# Patient Record
Sex: Female | Born: 1945 | Race: White | Hispanic: No | Marital: Single | Smoking: Current every day smoker
Health system: Southern US, Community
[De-identification: ages and names within clinical notes are randomized; demographics above are authoritative.]

## PROBLEM LIST (undated history)

## (undated) DIAGNOSIS — I639 Cerebral infarction, unspecified: Secondary | ICD-10-CM

## (undated) DIAGNOSIS — I4891 Unspecified atrial fibrillation: Secondary | ICD-10-CM

## (undated) DIAGNOSIS — M810 Age-related osteoporosis without current pathological fracture: Secondary | ICD-10-CM

## (undated) DIAGNOSIS — F17200 Nicotine dependence, unspecified, uncomplicated: Secondary | ICD-10-CM

## (undated) DIAGNOSIS — F329 Major depressive disorder, single episode, unspecified: Secondary | ICD-10-CM

## (undated) DIAGNOSIS — J449 Chronic obstructive pulmonary disease, unspecified: Secondary | ICD-10-CM

## (undated) DIAGNOSIS — F3289 Other specified depressive episodes: Secondary | ICD-10-CM

## (undated) DIAGNOSIS — R011 Cardiac murmur, unspecified: Secondary | ICD-10-CM

## (undated) DIAGNOSIS — M1990 Unspecified osteoarthritis, unspecified site: Secondary | ICD-10-CM

## (undated) DIAGNOSIS — J4489 Other specified chronic obstructive pulmonary disease: Secondary | ICD-10-CM

## (undated) DIAGNOSIS — Z8679 Personal history of other diseases of the circulatory system: Secondary | ICD-10-CM

## (undated) DIAGNOSIS — Z86718 Personal history of other venous thrombosis and embolism: Secondary | ICD-10-CM

## (undated) DIAGNOSIS — G8929 Other chronic pain: Secondary | ICD-10-CM

## (undated) DIAGNOSIS — I509 Heart failure, unspecified: Secondary | ICD-10-CM

## (undated) DIAGNOSIS — M549 Dorsalgia, unspecified: Secondary | ICD-10-CM

## (undated) DIAGNOSIS — E785 Hyperlipidemia, unspecified: Secondary | ICD-10-CM

## (undated) DIAGNOSIS — I10 Essential (primary) hypertension: Secondary | ICD-10-CM

## (undated) DIAGNOSIS — J45909 Unspecified asthma, uncomplicated: Secondary | ICD-10-CM

## (undated) HISTORY — DX: Heart failure, unspecified: I50.9

## (undated) HISTORY — DX: Age-related osteoporosis without current pathological fracture: M81.0

## (undated) HISTORY — DX: Personal history of other venous thrombosis and embolism: Z86.718

## (undated) HISTORY — DX: Unspecified asthma, uncomplicated: J45.909

## (undated) HISTORY — DX: Major depressive disorder, single episode, unspecified: F32.9

## (undated) HISTORY — DX: Chronic obstructive pulmonary disease, unspecified: J44.9

## (undated) HISTORY — DX: Essential (primary) hypertension: I10

## (undated) HISTORY — DX: Personal history of other diseases of the circulatory system: Z86.79

## (undated) HISTORY — PX: TONSILLECTOMY: SUR1361

## (undated) HISTORY — DX: Other specified depressive episodes: F32.89

## (undated) HISTORY — DX: Cardiac murmur, unspecified: R01.1

## (undated) HISTORY — DX: Unspecified atrial fibrillation: I48.91

## (undated) HISTORY — DX: Other specified chronic obstructive pulmonary disease: J44.89

## (undated) HISTORY — DX: Nicotine dependence, unspecified, uncomplicated: F17.200

## (undated) HISTORY — DX: Hyperlipidemia, unspecified: E78.5

---

## 1969-07-09 HISTORY — PX: APPENDECTOMY: SHX54

## 1999-11-09 DIAGNOSIS — Z86718 Personal history of other venous thrombosis and embolism: Secondary | ICD-10-CM

## 1999-11-09 HISTORY — DX: Personal history of other venous thrombosis and embolism: Z86.718

## 2002-10-05 ENCOUNTER — Emergency Department (HOSPITAL_COMMUNITY): Admission: EM | Admit: 2002-10-05 | Discharge: 2002-10-05 | Payer: Self-pay

## 2003-11-09 LAB — HM MAMMOGRAPHY: HM Mammogram: NORMAL

## 2004-06-08 HISTORY — PX: GASTRIC BYPASS: SHX52

## 2004-11-18 ENCOUNTER — Ambulatory Visit: Payer: Self-pay | Admitting: Internal Medicine

## 2004-11-24 ENCOUNTER — Ambulatory Visit: Payer: Self-pay

## 2004-11-30 ENCOUNTER — Ambulatory Visit: Payer: Self-pay | Admitting: Internal Medicine

## 2004-11-30 ENCOUNTER — Other Ambulatory Visit: Admission: RE | Admit: 2004-11-30 | Discharge: 2004-11-30 | Payer: Self-pay | Admitting: Internal Medicine

## 2004-12-08 ENCOUNTER — Ambulatory Visit: Payer: Self-pay | Admitting: Cardiology

## 2004-12-18 ENCOUNTER — Ambulatory Visit: Payer: Self-pay | Admitting: Cardiology

## 2005-01-01 ENCOUNTER — Ambulatory Visit: Payer: Self-pay | Admitting: Cardiology

## 2005-01-08 ENCOUNTER — Ambulatory Visit: Payer: Self-pay | Admitting: Cardiology

## 2005-01-18 ENCOUNTER — Ambulatory Visit: Payer: Self-pay | Admitting: Cardiology

## 2005-02-01 ENCOUNTER — Ambulatory Visit: Payer: Self-pay | Admitting: Internal Medicine

## 2005-02-01 ENCOUNTER — Ambulatory Visit: Payer: Self-pay | Admitting: Cardiology

## 2005-02-08 ENCOUNTER — Ambulatory Visit: Payer: Self-pay | Admitting: Cardiology

## 2005-02-22 ENCOUNTER — Ambulatory Visit: Payer: Self-pay | Admitting: *Deleted

## 2005-03-15 ENCOUNTER — Ambulatory Visit: Payer: Self-pay | Admitting: *Deleted

## 2005-04-12 ENCOUNTER — Ambulatory Visit: Payer: Self-pay | Admitting: Cardiology

## 2005-05-10 ENCOUNTER — Ambulatory Visit: Payer: Self-pay | Admitting: Cardiology

## 2005-05-21 ENCOUNTER — Ambulatory Visit: Payer: Self-pay | Admitting: Cardiovascular Disease

## 2005-06-23 ENCOUNTER — Ambulatory Visit: Payer: Self-pay | Admitting: Cardiology

## 2005-07-15 ENCOUNTER — Ambulatory Visit: Payer: Self-pay | Admitting: Internal Medicine

## 2005-08-13 ENCOUNTER — Ambulatory Visit: Payer: Self-pay | Admitting: Internal Medicine

## 2005-08-16 ENCOUNTER — Ambulatory Visit: Payer: Self-pay | Admitting: Cardiology

## 2005-10-01 ENCOUNTER — Ambulatory Visit: Payer: Self-pay | Admitting: Internal Medicine

## 2005-10-02 ENCOUNTER — Inpatient Hospital Stay (HOSPITAL_COMMUNITY): Admission: EM | Admit: 2005-10-02 | Discharge: 2005-10-03 | Payer: Self-pay | Admitting: Emergency Medicine

## 2005-10-05 ENCOUNTER — Ambulatory Visit: Payer: Self-pay | Admitting: Internal Medicine

## 2005-10-13 ENCOUNTER — Ambulatory Visit: Payer: Self-pay | Admitting: Gastroenterology

## 2005-10-13 ENCOUNTER — Ambulatory Visit: Payer: Self-pay | Admitting: Internal Medicine

## 2005-10-26 ENCOUNTER — Encounter (INDEPENDENT_AMBULATORY_CARE_PROVIDER_SITE_OTHER): Payer: Self-pay | Admitting: Specialist

## 2005-10-26 ENCOUNTER — Ambulatory Visit (HOSPITAL_COMMUNITY): Admission: RE | Admit: 2005-10-26 | Discharge: 2005-10-26 | Payer: Self-pay | Admitting: Gastroenterology

## 2005-10-26 ENCOUNTER — Ambulatory Visit: Payer: Self-pay | Admitting: Gastroenterology

## 2005-12-13 ENCOUNTER — Ambulatory Visit: Payer: Self-pay | Admitting: Internal Medicine

## 2006-01-10 ENCOUNTER — Ambulatory Visit: Payer: Self-pay | Admitting: Cardiology

## 2006-02-07 ENCOUNTER — Ambulatory Visit: Payer: Self-pay | Admitting: Cardiology

## 2006-03-07 ENCOUNTER — Ambulatory Visit: Payer: Self-pay | Admitting: Cardiology

## 2006-04-12 ENCOUNTER — Ambulatory Visit: Payer: Self-pay | Admitting: Cardiology

## 2006-04-26 ENCOUNTER — Ambulatory Visit: Payer: Self-pay | Admitting: Cardiology

## 2006-07-18 ENCOUNTER — Ambulatory Visit: Payer: Self-pay | Admitting: *Deleted

## 2006-08-19 ENCOUNTER — Ambulatory Visit: Payer: Self-pay | Admitting: Cardiology

## 2006-09-02 ENCOUNTER — Ambulatory Visit: Payer: Self-pay | Admitting: Internal Medicine

## 2006-10-10 ENCOUNTER — Ambulatory Visit: Payer: Self-pay | Admitting: Cardiology

## 2006-10-21 ENCOUNTER — Ambulatory Visit: Payer: Self-pay | Admitting: Internal Medicine

## 2006-11-04 ENCOUNTER — Ambulatory Visit: Payer: Self-pay | Admitting: Cardiology

## 2006-11-18 ENCOUNTER — Ambulatory Visit: Payer: Self-pay | Admitting: Internal Medicine

## 2006-12-09 ENCOUNTER — Ambulatory Visit: Payer: Self-pay | Admitting: Cardiology

## 2007-02-22 ENCOUNTER — Ambulatory Visit: Payer: Self-pay | Admitting: *Deleted

## 2007-03-08 ENCOUNTER — Ambulatory Visit: Payer: Self-pay | Admitting: Cardiology

## 2007-03-27 ENCOUNTER — Ambulatory Visit: Payer: Self-pay | Admitting: Internal Medicine

## 2007-04-10 ENCOUNTER — Ambulatory Visit: Payer: Self-pay | Admitting: Internal Medicine

## 2007-04-24 ENCOUNTER — Ambulatory Visit: Payer: Self-pay | Admitting: Cardiology

## 2007-05-25 ENCOUNTER — Ambulatory Visit: Payer: Self-pay | Admitting: Cardiology

## 2007-06-02 ENCOUNTER — Ambulatory Visit: Payer: Self-pay | Admitting: Cardiovascular Disease

## 2007-06-16 ENCOUNTER — Ambulatory Visit: Payer: Self-pay | Admitting: Cardiology

## 2007-07-03 ENCOUNTER — Ambulatory Visit: Payer: Self-pay | Admitting: Cardiology

## 2007-07-31 ENCOUNTER — Ambulatory Visit: Payer: Self-pay | Admitting: Internal Medicine

## 2007-08-28 ENCOUNTER — Ambulatory Visit: Payer: Self-pay | Admitting: Internal Medicine

## 2007-09-11 DIAGNOSIS — R011 Cardiac murmur, unspecified: Secondary | ICD-10-CM | POA: Insufficient documentation

## 2007-09-11 DIAGNOSIS — Z862 Personal history of diseases of the blood and blood-forming organs and certain disorders involving the immune mechanism: Secondary | ICD-10-CM | POA: Insufficient documentation

## 2007-09-11 DIAGNOSIS — I69959 Hemiplegia and hemiparesis following unspecified cerebrovascular disease affecting unspecified side: Secondary | ICD-10-CM | POA: Insufficient documentation

## 2007-09-11 DIAGNOSIS — I4891 Unspecified atrial fibrillation: Secondary | ICD-10-CM | POA: Insufficient documentation

## 2007-09-11 DIAGNOSIS — I10 Essential (primary) hypertension: Secondary | ICD-10-CM | POA: Insufficient documentation

## 2007-09-11 DIAGNOSIS — Z86718 Personal history of other venous thrombosis and embolism: Secondary | ICD-10-CM

## 2007-09-11 DIAGNOSIS — Z8679 Personal history of other diseases of the circulatory system: Secondary | ICD-10-CM

## 2007-09-11 DIAGNOSIS — Z8639 Personal history of other endocrine, nutritional and metabolic disease: Secondary | ICD-10-CM

## 2007-09-11 DIAGNOSIS — J45909 Unspecified asthma, uncomplicated: Secondary | ICD-10-CM | POA: Insufficient documentation

## 2007-09-15 ENCOUNTER — Ambulatory Visit: Payer: Self-pay | Admitting: Internal Medicine

## 2007-09-16 LAB — CONVERTED CEMR LAB
ALT: 18 units/L (ref 0–35)
AST: 22 units/L (ref 0–37)
Alkaline Phosphatase: 123 units/L — ABNORMAL HIGH (ref 39–117)
BUN: 20 mg/dL (ref 6–23)
Basophils Relative: 0.2 % (ref 0.0–1.0)
CO2: 33 meq/L — ABNORMAL HIGH (ref 19–32)
Calcium: 9.5 mg/dL (ref 8.4–10.5)
Chloride: 102 meq/L (ref 96–112)
Eosinophils Absolute: 0.1 10*3/uL (ref 0.0–0.6)
Eosinophils Relative: 1.7 % (ref 0.0–5.0)
GFR calc Af Amer: 132 mL/min
GFR calc non Af Amer: 109 mL/min
Glucose, Bld: 105 mg/dL — ABNORMAL HIGH (ref 70–99)
HDL: 37.4 mg/dL — ABNORMAL LOW (ref >39.0)
Lymphocytes Relative: 23.8 % (ref 12.0–46.0)
Monocytes Relative: 6.9 % (ref 3.0–11.0)
Neutro Abs: 5.3 10*3/uL (ref 1.4–7.7)
Platelets: 284 10*3/uL (ref 150–400)
RBC: 4.39 M/uL (ref 3.87–5.11)
Total CHOL/HDL Ratio: 5.3
Triglycerides: 165 mg/dL — ABNORMAL HIGH (ref 0–149)
VLDL: 33 mg/dL (ref 0–40)
WBC: 7.7 10*3/uL (ref 4.5–10.5)

## 2007-09-17 DIAGNOSIS — I509 Heart failure, unspecified: Secondary | ICD-10-CM | POA: Insufficient documentation

## 2007-09-17 DIAGNOSIS — E785 Hyperlipidemia, unspecified: Secondary | ICD-10-CM

## 2007-09-17 DIAGNOSIS — Z8679 Personal history of other diseases of the circulatory system: Secondary | ICD-10-CM | POA: Insufficient documentation

## 2007-09-26 ENCOUNTER — Telehealth: Payer: Self-pay | Admitting: Internal Medicine

## 2007-10-03 IMAGING — CT CT ANGIO ABDOMEN
2 of 6 series · 16 of 46 positions shown, 18 images · IV contrast (omnipaque)
Comparison: none

CLINICAL DATA: chest pain, back pain, abdominal pain
CT ANGIOGRAPHY OF CHEST:
TECHNIQUE: Multidetector CT imaging of the chest was performed during bolus injection of intravenous contrast.  Multiplanar CT angiographic image reconstructions were generated to evaluate the vascular anatomy.
Contrast:  125 cc Omnipaque 300
TECHNIQUE: Multidetector CT imaging of the abdomen was performed during bolus injection of intravenous contrast.  Multiplanar CT angiographic image reconstructions were generated to evaluate the vascular anatomy.

[Series 4: dissection 2.0 st · axial · 0.65mm/px · z∈[-526,-8]mm · 13 of 299 slices shown, 15 images]
[im 20/299  soft-tissue]
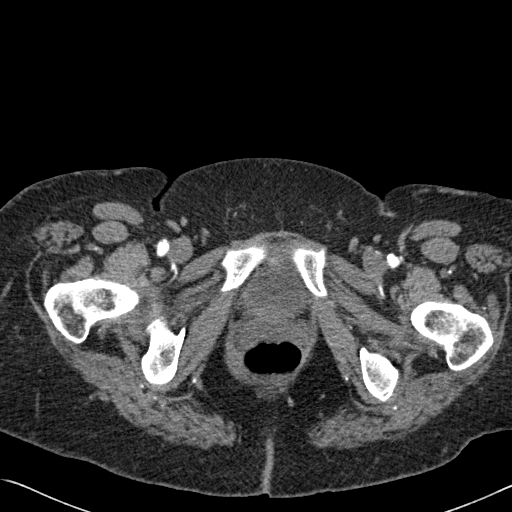
[im 20/299  bone]
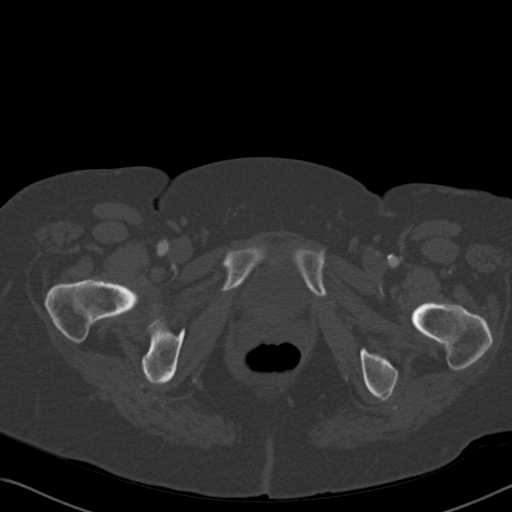
[im 40/299  soft-tissue]
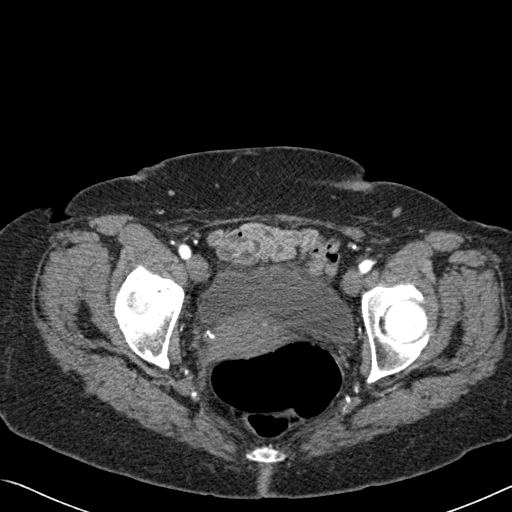
[im 60/299  soft-tissue]
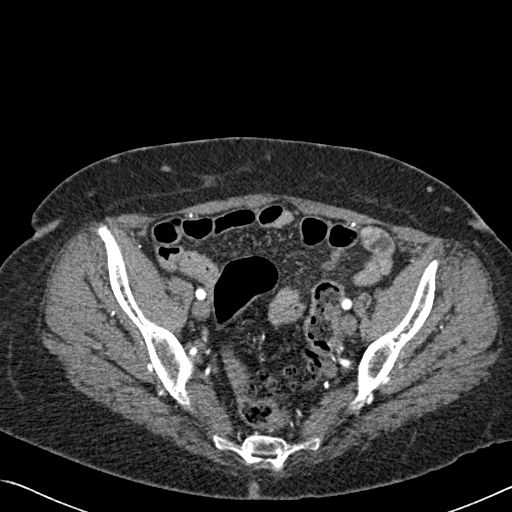
[im 80/299  soft-tissue]
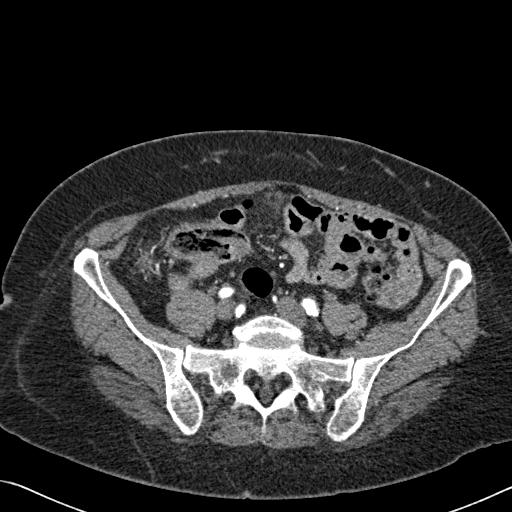
[im 100/299  soft-tissue]
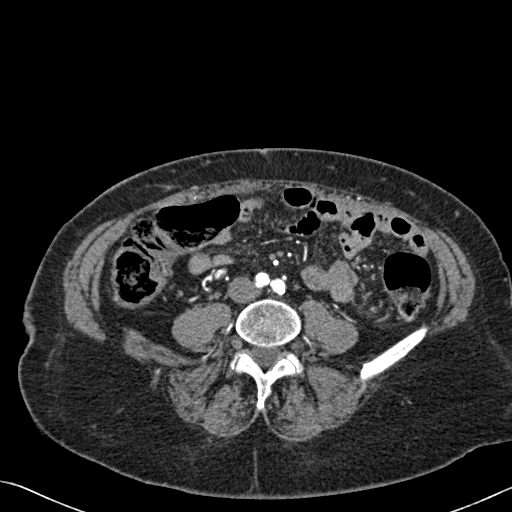
[im 120/299  soft-tissue]
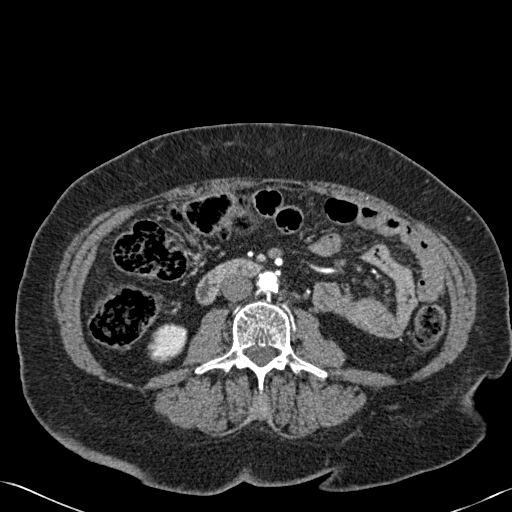
[im 159/299  soft-tissue]
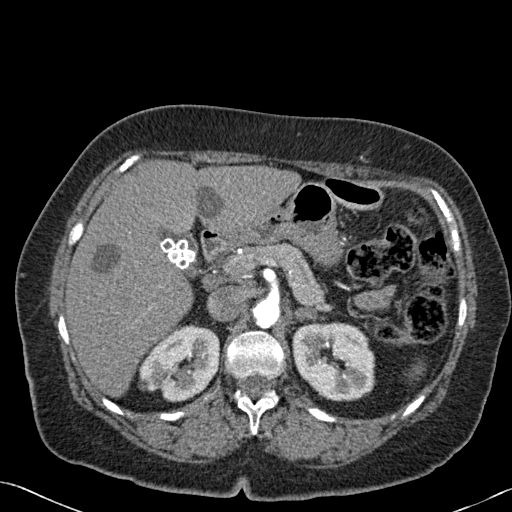
[im 179/299  soft-tissue]
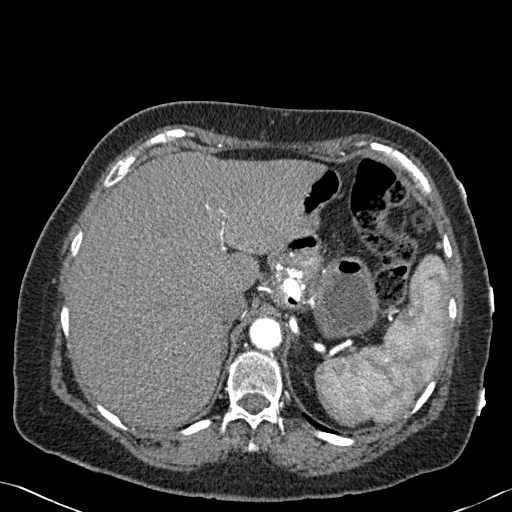
[im 199/299  soft-tissue]
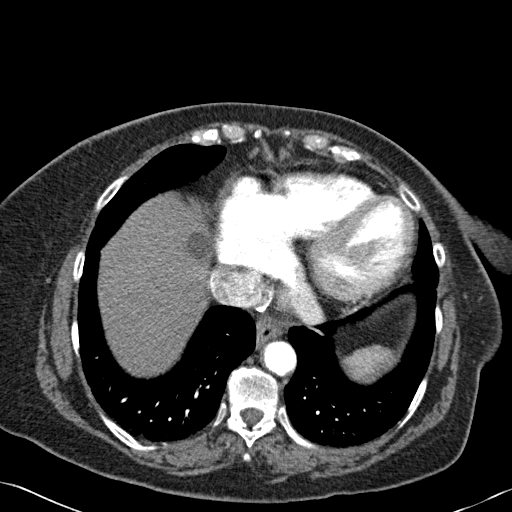
[im 199/299  bone]
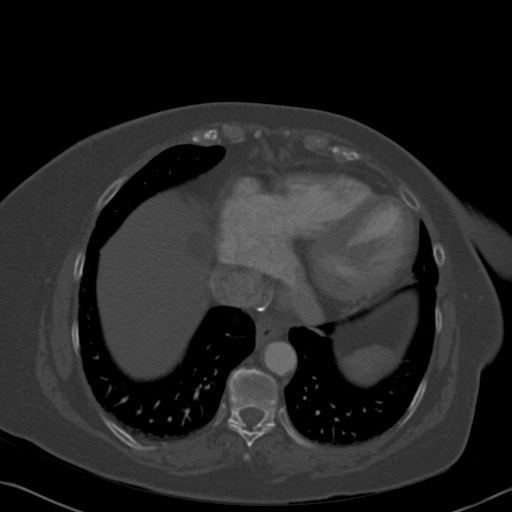
[im 219/299  soft-tissue]
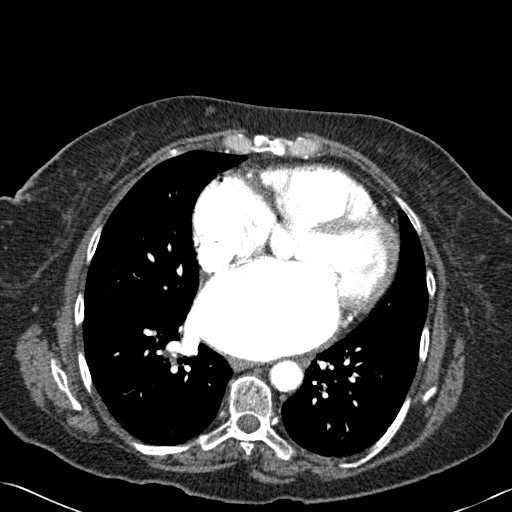
[im 239/299  soft-tissue]
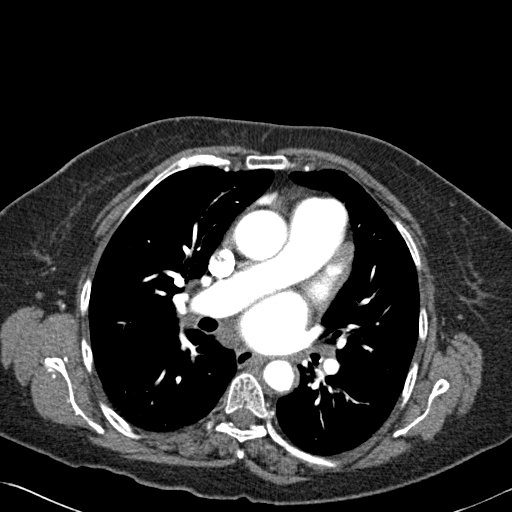
[im 259/299  soft-tissue]
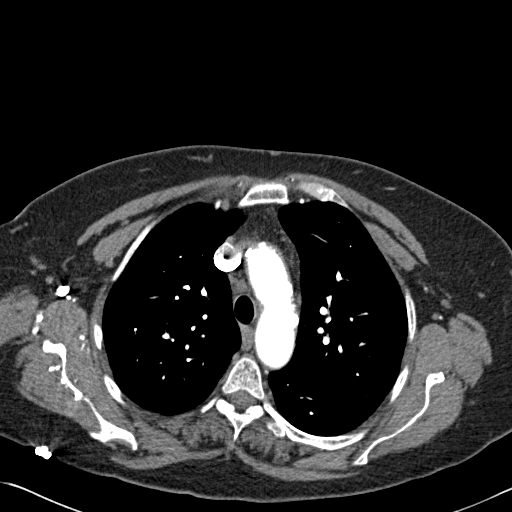
[im 279/299  soft-tissue]
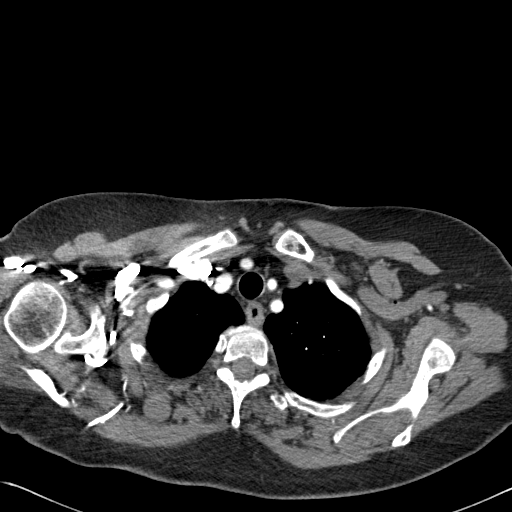

[Series 602: coronals · coronal · 1.17mm/px · 3 of 118 slices shown]
[im 40/118  soft-tissue]
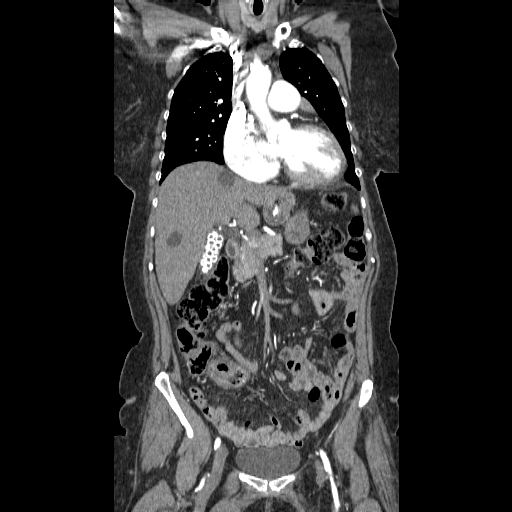
[im 53/118  soft-tissue]
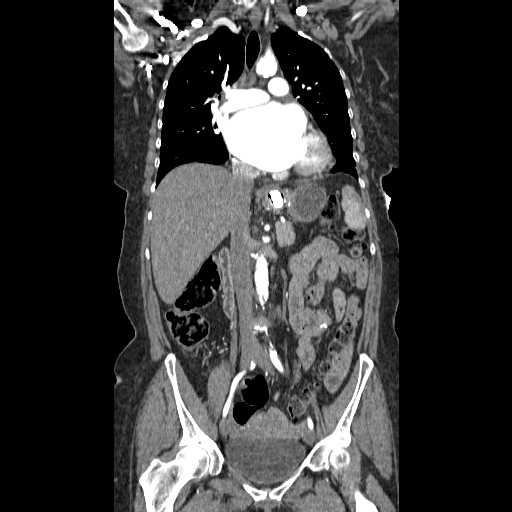
[im 66/118  soft-tissue]
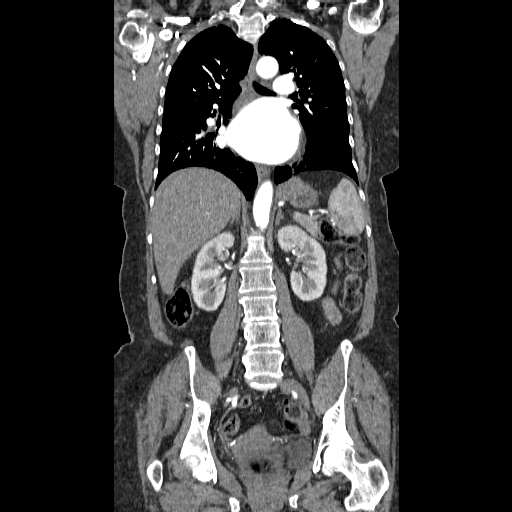

[16 of 46 positions shown; findings below may reference images not displayed]

FINDINGS: There are no filling defects identified in the pulmonary arterial system to suggest pulmonary emboli.  There is no evidence of aortic dissection or aneurysm.  Cardiomegaly with moderate to marked biatrial enlargement, left greater than right, noted.  There is no evidence of pleural or pericardial effusions.  Upper limits normal right hilar lymph nodes may be reactive.  Mild peribronchial thickening is identified in the lungs.  Remainder of the lungs are clear.  A 1.2 cm rounded lesion in the right breast is identified.  Visualized portions of the thyroid gland are unremarkable.
IMPRESSION: 1.  No evidence of aortic dissection or pulmonary emboli.
2.  Cardiomegaly with moderate to marked biatrial enlargement.
3.  Indeterminate 1.2cm rounded lesion in the right breast.  Recommend mammography and physical exam.
CT ANGIOGRAPHY OF ABDOMEN:
FINDINGS: There is no evidence of aortic dissection or aneurysm.  Mild atherosclerotic plaque and calcification of the abdominal aorta is noted.  Multiple bilateral hepatic cysts are identified.  Scarring in the posterior right upper kidney is identified but the remainder of the kidneys are unremarkable.  Multiple gallstones are present.  The adrenal glands and pancreas are unremarkable.  Two nodules along the colon at the splenic flexure (images # 113 through # 118) with the largest posterior nodule measuring 1cm.  Mildly enlarged periportal lymph nodes are identified.  There is evidence of gastric surgery with high-density material within the stomach.  No evidence of free fluid or biliary dilatation.
IMPRESSION: 1  No evidence of aortic aneurysm or dissection.
2.  Two nodules along the colon at the splenic flexure and mildly enlarged periportal lymph nodes.   2 to 3 month followup or PET/CT to assess stability/metabolic activity.
3.    Cholelithiasis.

## 2007-10-17 ENCOUNTER — Encounter: Admission: RE | Admit: 2007-10-17 | Discharge: 2007-10-17 | Payer: Self-pay | Admitting: Internal Medicine

## 2007-10-17 ENCOUNTER — Encounter: Payer: Self-pay | Admitting: Internal Medicine

## 2007-11-30 ENCOUNTER — Encounter: Admission: RE | Admit: 2007-11-30 | Discharge: 2007-11-30 | Payer: Self-pay | Admitting: Internal Medicine

## 2007-12-27 ENCOUNTER — Ambulatory Visit: Payer: Self-pay | Admitting: Internal Medicine

## 2008-01-09 ENCOUNTER — Ambulatory Visit: Payer: Self-pay | Admitting: Internal Medicine

## 2008-01-16 ENCOUNTER — Encounter: Payer: Self-pay | Admitting: Internal Medicine

## 2008-01-23 ENCOUNTER — Ambulatory Visit: Payer: Self-pay | Admitting: Cardiology

## 2008-02-06 ENCOUNTER — Ambulatory Visit: Payer: Self-pay | Admitting: Internal Medicine

## 2008-02-27 ENCOUNTER — Ambulatory Visit: Payer: Self-pay | Admitting: Cardiology

## 2008-03-26 ENCOUNTER — Ambulatory Visit: Payer: Self-pay | Admitting: Cardiology

## 2008-04-23 ENCOUNTER — Ambulatory Visit: Payer: Self-pay | Admitting: Cardiology

## 2008-05-23 ENCOUNTER — Ambulatory Visit: Payer: Self-pay | Admitting: Cardiovascular Disease

## 2008-06-25 ENCOUNTER — Ambulatory Visit: Payer: Self-pay | Admitting: Cardiology

## 2008-09-02 ENCOUNTER — Ambulatory Visit: Payer: Self-pay | Admitting: Cardiology

## 2008-10-25 ENCOUNTER — Ambulatory Visit: Payer: Self-pay | Admitting: Cardiovascular Disease

## 2008-11-22 ENCOUNTER — Ambulatory Visit: Payer: Self-pay | Admitting: Cardiology

## 2009-02-03 ENCOUNTER — Ambulatory Visit: Payer: Self-pay | Admitting: Cardiology

## 2009-02-24 ENCOUNTER — Ambulatory Visit: Payer: Self-pay | Admitting: Cardiology

## 2009-03-10 ENCOUNTER — Ambulatory Visit: Payer: Self-pay | Admitting: Internal Medicine

## 2009-03-10 ENCOUNTER — Telehealth (INDEPENDENT_AMBULATORY_CARE_PROVIDER_SITE_OTHER): Payer: Self-pay | Admitting: *Deleted

## 2009-03-10 DIAGNOSIS — F329 Major depressive disorder, single episode, unspecified: Secondary | ICD-10-CM

## 2009-03-10 DIAGNOSIS — F17200 Nicotine dependence, unspecified, uncomplicated: Secondary | ICD-10-CM

## 2009-03-22 ENCOUNTER — Telehealth: Payer: Self-pay | Admitting: Family Medicine

## 2009-04-08 ENCOUNTER — Encounter: Payer: Self-pay | Admitting: *Deleted

## 2009-04-28 ENCOUNTER — Ambulatory Visit: Payer: Self-pay | Admitting: Internal Medicine

## 2009-04-28 DIAGNOSIS — J4489 Other specified chronic obstructive pulmonary disease: Secondary | ICD-10-CM | POA: Insufficient documentation

## 2009-04-28 DIAGNOSIS — J449 Chronic obstructive pulmonary disease, unspecified: Secondary | ICD-10-CM

## 2009-05-14 ENCOUNTER — Encounter: Payer: Self-pay | Admitting: *Deleted

## 2009-05-26 ENCOUNTER — Ambulatory Visit: Payer: Self-pay | Admitting: Internal Medicine

## 2009-05-26 ENCOUNTER — Ambulatory Visit: Payer: Self-pay | Admitting: Cardiovascular Disease

## 2009-05-26 LAB — CONVERTED CEMR LAB: Prothrombin Time: 19.2 s

## 2009-06-24 ENCOUNTER — Ambulatory Visit: Payer: Self-pay | Admitting: Cardiovascular Disease

## 2009-07-15 ENCOUNTER — Ambulatory Visit: Payer: Self-pay | Admitting: Cardiology

## 2009-07-15 ENCOUNTER — Telehealth: Payer: Self-pay | Admitting: Internal Medicine

## 2009-07-15 LAB — CONVERTED CEMR LAB: POC INR: 2

## 2009-07-22 ENCOUNTER — Ambulatory Visit: Payer: Self-pay | Admitting: Internal Medicine

## 2009-07-28 ENCOUNTER — Telehealth: Payer: Self-pay | Admitting: Internal Medicine

## 2009-07-29 ENCOUNTER — Telehealth: Payer: Self-pay | Admitting: Internal Medicine

## 2009-07-29 ENCOUNTER — Ambulatory Visit: Payer: Self-pay | Admitting: Internal Medicine

## 2009-07-29 LAB — CONVERTED CEMR LAB
Basophils Absolute: 0 10*3/uL (ref 0.0–0.1)
Calcium: 9.4 mg/dL (ref 8.4–10.5)
Cholesterol: 182 mg/dL (ref 0–200)
GFR calc non Af Amer: 90.21 mL/min (ref >60)
HCT: 41 % (ref 36.0–46.0)
HDL: 40 mg/dL (ref >39.00)
Hemoglobin: 14.2 g/dL (ref 12.0–15.0)
Lymphs Abs: 1.2 10*3/uL (ref 0.7–4.0)
MCHC: 34.6 g/dL (ref 30.0–36.0)
MCV: 93 fL (ref 78.0–100.0)
Monocytes Absolute: 0.4 10*3/uL (ref 0.1–1.0)
Monocytes Relative: 5.8 % (ref 3.0–12.0)
Neutro Abs: 4.6 10*3/uL (ref 1.4–7.7)
Potassium: 3.8 meq/L (ref 3.5–5.1)
RDW: 12.4 % (ref 11.5–14.6)
Sodium: 142 meq/L (ref 135–145)
Triglycerides: 81 mg/dL (ref 0.0–149.0)
VLDL: 16.2 mg/dL (ref 0.0–40.0)

## 2009-08-05 ENCOUNTER — Encounter: Payer: Self-pay | Admitting: Internal Medicine

## 2009-08-05 ENCOUNTER — Ambulatory Visit: Payer: Self-pay | Admitting: Cardiology

## 2009-08-07 ENCOUNTER — Encounter: Payer: Self-pay | Admitting: Internal Medicine

## 2009-08-13 ENCOUNTER — Telehealth: Payer: Self-pay | Admitting: Internal Medicine

## 2009-08-26 ENCOUNTER — Ambulatory Visit: Payer: Self-pay | Admitting: Cardiology

## 2009-08-26 ENCOUNTER — Ambulatory Visit (HOSPITAL_COMMUNITY): Admission: RE | Admit: 2009-08-26 | Discharge: 2009-08-26 | Payer: Self-pay | Admitting: Obstetrics and Gynecology

## 2009-08-26 ENCOUNTER — Telehealth: Payer: Self-pay | Admitting: Internal Medicine

## 2009-08-29 ENCOUNTER — Telehealth: Payer: Self-pay | Admitting: Internal Medicine

## 2009-09-02 ENCOUNTER — Telehealth: Payer: Self-pay | Admitting: Internal Medicine

## 2009-09-13 ENCOUNTER — Ambulatory Visit: Payer: Self-pay | Admitting: Family Medicine

## 2009-09-13 DIAGNOSIS — J441 Chronic obstructive pulmonary disease with (acute) exacerbation: Secondary | ICD-10-CM

## 2009-09-16 ENCOUNTER — Ambulatory Visit: Payer: Self-pay | Admitting: Internal Medicine

## 2009-09-16 ENCOUNTER — Ambulatory Visit: Payer: Self-pay | Admitting: Cardiology

## 2009-11-26 ENCOUNTER — Encounter (INDEPENDENT_AMBULATORY_CARE_PROVIDER_SITE_OTHER): Payer: Self-pay | Admitting: Cardiology

## 2009-11-28 ENCOUNTER — Ambulatory Visit: Payer: Self-pay | Admitting: Cardiology

## 2009-11-28 LAB — CONVERTED CEMR LAB: POC INR: 1.6

## 2009-12-08 ENCOUNTER — Telehealth: Payer: Self-pay | Admitting: Internal Medicine

## 2009-12-12 ENCOUNTER — Ambulatory Visit: Payer: Self-pay | Admitting: Cardiovascular Disease

## 2009-12-16 ENCOUNTER — Ambulatory Visit: Payer: Self-pay | Admitting: Internal Medicine

## 2010-01-02 ENCOUNTER — Ambulatory Visit: Payer: Self-pay | Admitting: Internal Medicine

## 2010-01-30 ENCOUNTER — Ambulatory Visit: Payer: Self-pay | Admitting: Cardiology

## 2010-01-30 LAB — CONVERTED CEMR LAB: POC INR: 4.1

## 2010-02-20 ENCOUNTER — Ambulatory Visit: Payer: Self-pay | Admitting: Internal Medicine

## 2010-02-20 LAB — CONVERTED CEMR LAB: POC INR: 4.3

## 2010-03-10 ENCOUNTER — Ambulatory Visit: Payer: Self-pay | Admitting: Internal Medicine

## 2010-03-10 DIAGNOSIS — B070 Plantar wart: Secondary | ICD-10-CM

## 2010-03-10 DIAGNOSIS — M546 Pain in thoracic spine: Secondary | ICD-10-CM | POA: Insufficient documentation

## 2010-03-23 ENCOUNTER — Encounter: Payer: Self-pay | Admitting: Internal Medicine

## 2010-03-31 ENCOUNTER — Ambulatory Visit: Payer: Self-pay | Admitting: Internal Medicine

## 2010-03-31 LAB — CONVERTED CEMR LAB: POC INR: 3.4

## 2010-04-21 ENCOUNTER — Ambulatory Visit: Payer: Self-pay | Admitting: Cardiovascular Disease

## 2010-05-19 ENCOUNTER — Ambulatory Visit: Payer: Self-pay | Admitting: Cardiovascular Disease

## 2010-05-19 ENCOUNTER — Ambulatory Visit: Payer: Self-pay | Admitting: Internal Medicine

## 2010-05-19 LAB — CONVERTED CEMR LAB: POC INR: 1.9

## 2010-06-16 ENCOUNTER — Ambulatory Visit: Payer: Self-pay | Admitting: Cardiology

## 2010-06-30 ENCOUNTER — Ambulatory Visit: Payer: Self-pay | Admitting: Cardiovascular Disease

## 2010-08-11 ENCOUNTER — Ambulatory Visit: Payer: Self-pay | Admitting: Cardiology

## 2010-09-08 ENCOUNTER — Ambulatory Visit: Payer: Self-pay | Admitting: Cardiovascular Disease

## 2010-09-08 LAB — CONVERTED CEMR LAB: POC INR: 3.6

## 2010-09-22 ENCOUNTER — Ambulatory Visit: Payer: Self-pay | Admitting: Internal Medicine

## 2010-09-23 LAB — CONVERTED CEMR LAB
BUN: 35 mg/dL — ABNORMAL HIGH (ref 6–23)
Cholesterol: 213 mg/dL — ABNORMAL HIGH (ref 0–200)
Creatinine, Ser: 1 mg/dL (ref 0.4–1.2)
Direct LDL: 150.2 mg/dL
GFR calc non Af Amer: 63.18 mL/min (ref >60)
HDL: 48.6 mg/dL (ref >39.00)
Potassium: 3.9 meq/L (ref 3.5–5.1)
VLDL: 22.8 mg/dL (ref 0.0–40.0)

## 2010-09-25 ENCOUNTER — Encounter: Payer: Self-pay | Admitting: Internal Medicine

## 2010-09-25 ENCOUNTER — Ambulatory Visit: Payer: Self-pay | Admitting: Internal Medicine

## 2010-09-29 ENCOUNTER — Ambulatory Visit: Payer: Self-pay | Admitting: Cardiovascular Disease

## 2010-10-03 DIAGNOSIS — M810 Age-related osteoporosis without current pathological fracture: Secondary | ICD-10-CM

## 2010-10-20 ENCOUNTER — Ambulatory Visit: Payer: Self-pay | Admitting: Internal Medicine

## 2010-11-13 ENCOUNTER — Ambulatory Visit: Admission: RE | Admit: 2010-11-13 | Discharge: 2010-11-13 | Payer: Self-pay | Source: Home / Self Care

## 2010-11-24 ENCOUNTER — Ambulatory Visit: Admission: RE | Admit: 2010-11-24 | Discharge: 2010-11-24 | Payer: Self-pay | Source: Home / Self Care

## 2010-12-10 NOTE — Medication Information (Signed)
Summary: rov/tm  Anticoagulant Therapy  Managed by: Bethena Midget, RN, BSN Referring MD: Illene Regulus PCP: Newt Lukes MD Supervising MD: Gala Romney MD, Reuel Boom Indication 1: Pulmonary embolus (ICD-415.19) Indication 2: Atrial Fibrillation (ICD-427.31) Lab Used: [facility] Site: [facility] INR POC 1.7 INR RANGE 2.0-3.0  Dietary changes: no    Health status changes: no    Bleeding/hemorrhagic complications: no    Recent/future hospitalizations: no    Any changes in medication regimen? no    Recent/future dental: no  Any missed doses?: no       Is patient compliant with meds? yes       Allergies: No Known Drug Allergies  Anticoagulation Management History:      The patient is taking warfarin and comes in today for a routine follow up visit.  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  Negative risk factors for bleeding include an age less than 53 years old.  The bleeding index is 'intermediate risk'.  Positive CHADS2 values include History of CHF, History of HTN, and Prior Stroke/CVA/TIA.  Negative CHADS2 values include Age > 8 years old.  The start date was 01/08/2005.  Anticoagulation responsible provider: Bensimhon MD, Reuel Boom.  INR POC: 1.7.  Cuvette Lot#: [id number].  Exp: 12/2011.    Anticoagulation Management Assessment/Plan:      The patient's current anticoagulation dose is Warfarin sodium 5 mg tabs: take 1 by mouth once daily.  The target INR is 2.0-3.0.  The next INR is due 11/13/2010.  Anticoagulation instructions were given to patient.  Results were reviewed/authorized by Bethena Midget, RN, BSN.  She was notified by Bethena Midget, RN, BSN.         Prior Anticoagulation Instructions: INR 1.8 Today take 1 pill then resume 1/2 pill everyday except 1 pill on Mondays and Thursdays. Recheck in 3 weeks.   Current Anticoagulation Instructions: INR 1.7 Today take 5mg s then change dose to 2.5mg s everyday except 5mg s on Mondays, Thursdays and  Saturdays. Recheck in 3 weeks.    Medication Administration  Injection # 1:    Medication: Vit B12 1000 mcg    Diagnosis: anemia    Route: IM    Site: R deltoid    Exp Date: 07/2011    Lot #: [id number]    Mfr: [employer] Regent    Comments: Received monthly Vitamin B 12 injection in office today with CVRR appt. Tolerated well.     Patient tolerated injection without complications    Given by: Bethena Midget, RN, BSN (October 20, 2010 12:37 PM)

## 2010-12-10 NOTE — Miscellaneous (Signed)
Summary: vitamin B12 shot  Clinical Lists Changes       Medication Administration  Injection # 1:    Medication: Vit B12 1000 mcg    Route: SQ    Site: R deltoid    Exp Date: 07/2011    Lot #: [id number]    Mfr: [employer] Regent    Patient tolerated injection without complications    Given by: Cloyde Reams RN (January 02, 2010 8:29 AM)

## 2010-12-10 NOTE — Medication Information (Signed)
Summary: rov/ez  Anticoagulant Therapy  Managed by: Cloyde Reams, RN Referring MD: Illene Regulus PCP: Newt Lukes MD Supervising MD: Eden Emms MD, Theron Arista Indication 1: Pulmonary embolus (ICD-415.19) Indication 2: Atrial Fibrillation (ICD-427.31) Lab Used: [facility] Site: [facility] INR POC 2.7 INR RANGE 2.0-3.0  Dietary changes: no    Health status changes: no    Bleeding/hemorrhagic complications: no    Recent/future hospitalizations: no    Any changes in medication regimen? no    Recent/future dental: no  Any missed doses?: no       Is patient compliant with meds? yes       Allergies (verified): No Known Drug Allergies  Anticoagulation Management History:      The patient is taking warfarin and comes in today for a routine follow up visit.  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  Negative risk factors for bleeding include an age less than 84 years old.  The bleeding index is 'intermediate risk'.  Positive CHADS2 values include History of CHF, History of HTN, and Prior Stroke/CVA/TIA.  Negative CHADS2 values include Age > 26 years old.  The start date was 01/08/2005.  Anticoagulation responsible provider: Eden Emms MD, Theron Arista.  INR POC: 2.7.  Cuvette Lot#: [id number].  Exp: 02/2011.    Anticoagulation Management Assessment/Plan:      The patient's current anticoagulation dose is Warfarin sodium 5 mg tabs: take 1 by mouth qd, Coumadin 5 mg tabs: as directed.  The target INR is 2.0-3.0.  The next INR is due 01/02/2010.  Anticoagulation instructions were given to patient.  Results were reviewed/authorized by Cloyde Reams, RN.  She was notified by Cloyde Reams RN.         Prior Anticoagulation Instructions: INR: 1.6 Take 1 and 1/2 tablets today and tomorrow then change dose to 5mg  tablet daily except 2.5mg  on Sundays and Thursdays Recheck in 2 weeks  Current Anticoagulation Instructions: INR 2.7  Continue on same dosage 1 tablet daily except  1/2 tablet on Sundays and Thursdays.  Recheck in 3 weeks.

## 2010-12-10 NOTE — Consult Note (Signed)
Summary: [facility]   Imported By: Sherian Rein 03/26/2010 08:50:14  _____________________________________________________________________  External Attachment:    Type:   Image     Comment:   External Document

## 2010-12-10 NOTE — Medication Information (Signed)
Summary: rov/ewj  Anticoagulant Therapy  Managed by: Cloyde Reams, RN, BSN Referring MD: Illene Regulus PCP: Newt Lukes MD Supervising MD: Clifton James MD, Cristal Deer Indication 1: Pulmonary embolus (ICD-415.19) Indication 2: Atrial Fibrillation (ICD-427.31) Lab Used: [facility] Site: [facility] INR POC 3.6 INR RANGE 2.0-3.0  Dietary changes: no    Health status changes: no    Bleeding/hemorrhagic complications: no    Recent/future hospitalizations: no    Any changes in medication regimen? no    Recent/future dental: no  Any missed doses?: no       Is patient compliant with meds? yes       Allergies: No Known Drug Allergies  Anticoagulation Management History:      The patient is taking warfarin and comes in today for a routine follow up visit.  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  Negative risk factors for bleeding include an age less than 82 years old.  The bleeding index is 'intermediate risk'.  Positive CHADS2 values include History of CHF, History of HTN, and Prior Stroke/CVA/TIA.  Negative CHADS2 values include Age > 65 years old.  The start date was 01/08/2005.  Anticoagulation responsible provider: Clifton James MD, Cristal Deer.  INR POC: 3.6.  Cuvette Lot#: [id number].  Exp: 09/2011.    Anticoagulation Management Assessment/Plan:      The patient's current anticoagulation dose is Warfarin sodium 5 mg tabs: take 1 by mouth qd.  The target INR is 2.0-3.0.  The next INR is due 09/29/2010.  Anticoagulation instructions were given to patient.  Results were reviewed/authorized by Cloyde Reams, RN, BSN.  She was notified by Cloyde Reams RN.         Prior Anticoagulation Instructions: INR 3.0  Continue on same dosage 1/2 tablet daily except 1 tablet on Mondays, Thursdays, and Saturdays.  Recheck in 4 weeks.    Current Anticoagulation Instructions: INR 3.6  Skip today's dosage of Coumadin, then start taking 1/2 tablet daily except 1 tablet  on Mondays and Thursdays.  Recheck in 3 weeks.     Medication Administration  Injection # 1:    Medication: Vit B12 1000 mcg    Diagnosis: Anemia    Route: IM    Site: R deltoid    Exp Date: 07/2011    Lot #: [id number]    Mfr: [employer] Regent    Patient tolerated injection without complications    Given by: Cloyde Reams RN (September 08, 2010 12:06 PM)

## 2010-12-10 NOTE — Medication Information (Signed)
Summary: rov/ewj  Anticoagulant Therapy  Managed by: Cloyde Reams, RN Referring MD: Illene Regulus PCP: Newt Lukes MD Supervising MD: Daleen Squibb MD, Maisie Fus Indication 1: Pulmonary embolus (ICD-415.19) Indication 2: Atrial Fibrillation (ICD-427.31) Lab Used: [facility] Site: [facility] INR POC 1.6 INR RANGE 2.0-3.0  Dietary changes: no    Health status changes: no    Bleeding/hemorrhagic complications: yes       Details: nosebleed  Recent/future hospitalizations: no    Any changes in medication regimen? no    Recent/future dental: no  Any missed doses?: yes     Details: Missed couple doses during christmas time then missed yesterday dose  Is patient compliant with meds? yes       Allergies (verified): No Known Drug Allergies  Anticoagulation Management History:      The patient is taking warfarin and comes in today for a routine follow up visit.  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  Negative risk factors for bleeding include an age less than 31 years old.  The bleeding index is 'intermediate risk'.  Positive CHADS2 values include History of CHF, History of HTN, and Prior Stroke/CVA/TIA.  Negative CHADS2 values include Age > 37 years old.  The start date was 01/08/2005.  Anticoagulation responsible provider: Daleen Squibb MD, Maisie Fus.  INR POC: 1.6.  Cuvette Lot#: [id number].  Exp: 02/2011.    Anticoagulation Management Assessment/Plan:      The patient's current anticoagulation dose is Warfarin sodium 5 mg tabs: take 1 by mouth qd, Coumadin 5 mg tabs: as directed.  The target INR is 2.0-3.0.  The next INR is due 12/12/2009.  Anticoagulation instructions were given to patient.  Results were reviewed/authorized by Cloyde Reams, RN.  She was notified by Barbarann Ehlers, Pharm D Candidate.         Prior Anticoagulation Instructions: INR: 3.5  Skip today's dose then start the new regimen of 1 tablet daily except 1/2 tablet on Tuesdays, Thursdays and  Sundays.  Recheck in 2 weeks.  Current Anticoagulation Instructions: INR: 1.6 Take 1 and 1/2 tablets today and tomorrow then change dose to 5mg  tablet daily except 2.5mg  on Sundays and Thursdays Recheck in 2 weeks

## 2010-12-10 NOTE — Medication Information (Signed)
Summary: rov/eac  Anticoagulant Therapy  Managed by: Cloyde Reams, RN, BSN Referring MD: Illene Regulus PCP: Newt Lukes MD Supervising MD: Jens Som MD, Arlys John Indication 1: Pulmonary embolus (ICD-415.19) Indication 2: Atrial Fibrillation (ICD-427.31) Lab Used: [facility] Site: [facility] INR POC 4.1 INR RANGE 2.0-3.0  Dietary changes: no    Health status changes: no    Bleeding/hemorrhagic complications: no    Recent/future hospitalizations: no    Any changes in medication regimen? no    Recent/future dental: no  Any missed doses?: yes     Details: Took 1 tablet last pm instead of 1/2 tablet.    Is patient compliant with meds? yes       Allergies (verified): No Known Drug Allergies  Anticoagulation Management History:      The patient is taking warfarin and comes in today for a routine follow up visit.  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  Negative risk factors for bleeding include an age less than 21 years old.  The bleeding index is 'intermediate risk'.  Positive CHADS2 values include History of CHF, History of HTN, and Prior Stroke/CVA/TIA.  Negative CHADS2 values include Age > 57 years old.  The start date was 01/08/2005.  Anticoagulation responsible provider: Jens Som MD, Arlys John.  INR POC: 4.1.  Cuvette Lot#: [id number].  Exp: 03/2011.    Anticoagulation Management Assessment/Plan:      The patient's current anticoagulation dose is Warfarin sodium 5 mg tabs: take 1 by mouth qd, Coumadin 5 mg tabs: as directed.  The target INR is 2.0-3.0.  The next INR is due 02/20/2010.  Anticoagulation instructions were given to patient.  Results were reviewed/authorized by Cloyde Reams, RN, BSN.  She was notified by Cloyde Reams RN.         Prior Anticoagulation Instructions: INR 2.8  Continue current dosing schedule of 1/2 tablet on Sunday and Thursday and 1 tablet all other days.  Return to clinic in 4 weeks.   Current Anticoagulation  Instructions: INR 4.1  Skip today's dose of coumadin then take 1/2 tablet on Saturday, then resume same dosage 1 tablet daily except 1/2 tablet on Sundays and Thursdays.  Recheck in 3 weeks.     Medication Administration  Injection # 1:    Medication: Vit B12 1000 mcg    Route: SQ    Site: R deltoid    Exp Date: 07/2011    Lot #: [id number]    Mfr: [employer] Regent    Patient tolerated injection without complications    Given by: Cloyde Reams RN (January 30, 2010 8:58 AM)

## 2010-12-10 NOTE — Medication Information (Signed)
Summary: rov/ewj  Anticoagulant Therapy  Managed by: Bethena Midget, RN, BSN Referring MD: Illene Regulus PCP: Newt Lukes MD Supervising MD: Clifton James MD, Cristal Deer Indication 1: Pulmonary embolus (ICD-415.19) Indication 2: Atrial Fibrillation (ICD-427.31) Lab Used: [facility] Site: [facility] INR POC 1.8 INR RANGE 2.0-3.0  Dietary changes: yes       Details: Ate liver couple times this week  Health status changes: no    Bleeding/hemorrhagic complications: no    Recent/future hospitalizations: no    Any changes in medication regimen? no    Recent/future dental: no  Any missed doses?: no       Is patient compliant with meds? yes       Allergies: No Known Drug Allergies  Anticoagulation Management History:      The patient is taking warfarin and comes in today for a routine follow up visit.  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  Negative risk factors for bleeding include an age less than 86 years old.  The bleeding index is 'intermediate risk'.  Positive CHADS2 values include History of CHF, History of HTN, and Prior Stroke/CVA/TIA.  Negative CHADS2 values include Age > 47 years old.  The start date was 01/08/2005.  Anticoagulation responsible provider: Clifton James MD, Cristal Deer.  INR POC: 1.8.  Cuvette Lot#: [id number].  Exp: 10/2011.    Anticoagulation Management Assessment/Plan:      The patient's current anticoagulation dose is Warfarin sodium 5 mg tabs: take 1 by mouth once daily.  The target INR is 2.0-3.0.  The next INR is due 10/20/2010.  Anticoagulation instructions were given to patient.  Results were reviewed/authorized by Bethena Midget, RN, BSN.  She was notified by Bethena Midget, RN, BSN.         Prior Anticoagulation Instructions: INR 3.6  Skip today's dosage of Coumadin, then start taking 1/2 tablet daily except 1 tablet on Mondays and Thursdays.  Recheck in 3 weeks.    Current Anticoagulation Instructions: INR 1.8 Today take 1  pill then resume 1/2 pill everyday except 1 pill on Mondays and Thursdays. Recheck in 3 weeks.

## 2010-12-10 NOTE — Medication Information (Signed)
Summary: rov/sp  Anticoagulant Therapy  Managed by: Weston Brass, PharmD Referring MD: Illene Regulus PCP: Newt Lukes MD Supervising MD: Excell Seltzer MD, Casimiro Needle Indication 1: Pulmonary embolus (ICD-415.19) Indication 2: Atrial Fibrillation (ICD-427.31) Lab Used: [facility] Site: [facility] INR POC 2.0 INR RANGE 2.0-3.0  Dietary changes: no    Health status changes: no    Bleeding/hemorrhagic complications: no    Recent/future hospitalizations: no    Any changes in medication regimen? yes       Details: taking berry extract  Recent/future dental: no  Any missed doses?: no       Is patient compliant with meds? yes      Comments: Pt out of town until 7/6 and can only come to appt on Tuesday.    Allergies: No Known Drug Allergies  Anticoagulation Management History:      The patient is taking warfarin and comes in today for a routine follow up visit.  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  Negative risk factors for bleeding include an age less than 73 years old.  The bleeding index is 'intermediate risk'.  Positive CHADS2 values include History of CHF, History of HTN, and Prior Stroke/CVA/TIA.  Negative CHADS2 values include Age > 53 years old.  The start date was 01/08/2005.  Anticoagulation responsible provider: Excell Seltzer MD, Casimiro Needle.  INR POC: 2.0.  Cuvette Lot#: [id number].  Exp: 06/2011.    Anticoagulation Management Assessment/Plan:      The patient's current anticoagulation dose is Warfarin sodium 5 mg tabs: take 1 by mouth qd, Coumadin 5 mg tabs: as directed.  The target INR is 2.0-3.0.  The next INR is due 05/19/2010.  Anticoagulation instructions were given to patient.  Results were reviewed/authorized by Weston Brass, PharmD.  She was notified by Weston Brass PharmD.         Prior Anticoagulation Instructions: INR 3.4  Skip today's dose of Coumadin then start new dose of 1/2 tablet every day except 1 tablet on Monday and Friday   Current  Anticoagulation Instructions: INR 2.0  Take 1 tablet today then resume same dose of 1/2 tablet every day except 1 tablet on Monday and Thursday.

## 2010-12-10 NOTE — Medication Information (Signed)
Summary: rov/ewj  Anticoagulant Therapy  Managed by: Elaina Pattee, PharmD Referring MD: Illene Regulus PCP: Newt Lukes MD Supervising MD: Tenny Craw MD, Gunnar Fusi Indication 1: Pulmonary embolus (ICD-415.19) Indication 2: Atrial Fibrillation (ICD-427.31) Lab Used: [facility] Site: [facility] INR POC 4.3 INR RANGE 2.0-3.0  Dietary changes: yes       Details: Has not eaten as many greens.  Health status changes: no    Bleeding/hemorrhagic complications: no    Recent/future hospitalizations: no    Any changes in medication regimen? no    Recent/future dental: no  Any missed doses?: no       Is patient compliant with meds? yes       Allergies: No Known Drug Allergies  Anticoagulation Management History:      The patient is taking warfarin and comes in today for a routine follow up visit.  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  Negative risk factors for bleeding include an age less than 69 years old.  The bleeding index is 'intermediate risk'.  Positive CHADS2 values include History of CHF, History of HTN, and Prior Stroke/CVA/TIA.  Negative CHADS2 values include Age > 89 years old.  The start date was 01/08/2005.  Anticoagulation responsible provider: Tenny Craw MD, Gunnar Fusi.  INR POC: 4.3.  Cuvette Lot#: [id number].  Exp: 03/2011.    Anticoagulation Management Assessment/Plan:      The patient's current anticoagulation dose is Warfarin sodium 5 mg tabs: take 1 by mouth qd, Coumadin 5 mg tabs: as directed.  The target INR is 2.0-3.0.  The next INR is due 03/02/2010.  Anticoagulation instructions were given to patient.  Results were reviewed/authorized by Elaina Pattee, PharmD.  She was notified by Elaina Pattee, PharmD.         Prior Anticoagulation Instructions: INR 4.1  Skip today's dose of coumadin then take 1/2 tablet on Saturday, then resume same dosage 1 tablet daily except 1/2 tablet on Sundays and Thursdays.  Recheck in 3 weeks.    Current  Anticoagulation Instructions: INR 4.3. Hold Coumadin today, then take 0.5 tablet daily except 1 tablet on Mon, Wed, Fri.  Recheck in 7-14 days.

## 2010-12-10 NOTE — Miscellaneous (Signed)
  Clinical Lists Changes       Medication Administration  Injection # 1:    Medication: Vit B12 1000 mcg    Route: IM    Site: R deltoid    Exp Date: 04/2010    Lot #: [id number]    Mfr: [employer] Regent    Patient tolerated injection without complications    Given by: Bethena Midget, RN, BSN (November 28, 2009 3:26 PM)

## 2010-12-10 NOTE — Medication Information (Signed)
Summary: rov/ln      Allergies Added: NKDA Anticoagulant Therapy  Managed by: Reina Fuse, PharmD Referring MD: Illene Regulus PCP: Newt Lukes MD Supervising MD: Daleen Squibb MD, Maisie Fus Indication 1: Pulmonary embolus (ICD-415.19) Indication 2: Atrial Fibrillation (ICD-427.31) Lab Used: [facility] Site: [facility] INR POC 1.6 INR RANGE 2.0-3.0  Dietary changes: no    Health status changes: no    Bleeding/hemorrhagic complications: no    Recent/future hospitalizations: no    Any changes in medication regimen? no    Recent/future dental: no  Any missed doses?: no       Is patient compliant with meds? yes       Current Medications (verified): 1)  Furosemide 80 Mg Tabs (Furosemide) .... Take 1 Tablet By Mouth Two Times A Day 2)  Gemfibrozil 600 Mg Tabs (Gemfibrozil) .... 2 By Mouth Once Daily 3)  Klor-Con M20 20 Meq Tbcr (Potassium Chloride Crys Cr) .... Take 1 Tablet By Mouth Once A Day 4)  Lisinopril 20 Mg Tabs (Lisinopril) .Marland Kitchen.. 1 Once Daily Pt Needs Office Visit 5)  Warfarin Sodium 5 Mg Tabs (Warfarin Sodium) .... Take 1 By Mouth Qd 6)  Metoprolol Succinate 100 Mg  Tb24 (Metoprolol Succinate) .... Take 1 Tablet By Mouth Once A Day 7)  Combivent 103-18 Mcg/act Aero (Ipratropium-Albuterol) .... 2 Puffs Every 4 Hours As Needed For Breathing 8)  Robaxin 500 Mg Tabs (Methocarbamol) .Marland Kitchen.. 1 By Mouth Every 8 Hours As Needed For Muscle Spasm Pain  Allergies (verified): No Known Drug Allergies  Anticoagulation Management History:      The patient is taking warfarin and comes in today for a routine follow up visit.  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  Negative risk factors for bleeding include an age less than 58 years old.  The bleeding index is 'intermediate risk'.  Positive CHADS2 values include History of CHF, History of HTN, and Prior Stroke/CVA/TIA.  Negative CHADS2 values include Age > 72 years old.  The start date was 01/08/2005.   Anticoagulation responsible Reyes Fifield: Daleen Squibb MD, Maisie Fus.  INR POC: 1.6.  Exp: 07/2011.    Anticoagulation Management Assessment/Plan:      The patient's current anticoagulation dose is Warfarin sodium 5 mg tabs: take 1 by mouth qd.  The target INR is 2.0-3.0.  The next INR is due 07/07/2010.  Anticoagulation instructions were given to patient.  Results were reviewed/authorized by Reina Fuse, PharmD.  She was notified by Reina Fuse PharmD.         Prior Anticoagulation Instructions: INR 1.9  Take 1 tablet today then resume normal regimen of 0.5 tab on Sunday, Tuesday, Wednesday, Friday, and Saturday and 1 tab on Monday and Thursday.  Re-check INR in 4 weeks.  Current Anticoagulation Instructions: INR 1.6  Take Coumadin 1 tab (5 mg) tonight.  Then, take Coumadin 0.5 tab (2.5 mg) on Sun, Tues, Wed, Fri and Coumadin 1 tab (5 mg) on Mon, Thur, Sat. Return to clinic in 3 weeks.    Medication Administration  Injection # 1:    Medication: Vit B12 1000 mcg    Route: IM    Site: L deltoid    Exp Date: 07/2011    Lot #: [id number]    Mfr: American Regent    Patient tolerated injection without complications    Given by: Bethena Midget, RN, BSN

## 2010-12-10 NOTE — Progress Notes (Signed)
Summary: metoprolol  Phone Note Refill Request Message from:  Fax from Pharmacy on December 08, 2009 8:34 AM  Refills Requested: Medication #1:  METOPROLOL SUCCINATE 100 MG  TB24 Take 1 tablet by mouth once a day   Last Refilled: 11/03/2009  Method Requested: Electronic Initial call taken by: Orlan Leavens,  December 08, 2009 8:35 AM    Prescriptions: METOPROLOL SUCCINATE 100 MG  TB24 (METOPROLOL SUCCINATE) Take 1 tablet by mouth once a day  #30 x 5   Entered by:   Orlan Leavens   Authorized by:   Newt Lukes MD   Signed by:   Orlan Leavens on 12/08/2009   Method used:   [facility] to        [facility] [facility]       [address]       [place], [place]  [address]       Ph: [phone] or [phone]       Fax: [phone]   RxID:   [id number]   Appended Document: metoprolol & lisinopril     Clinical Lists Changes  Medications: Rx of LISINOPRIL 20 MG TABS (LISINOPRIL) 1 once daily PT NEEDS office visit;  #30 x 5;  Signed;  Entered by: Orlan Leavens;  Authorized by: Newt Lukes MD;  Method used: [facility], [place], [place]  [address], Ph: [phone] or [phone], Fax: [phone]    Prescriptions: LISINOPRIL 20 MG TABS (LISINOPRIL) 1 once daily PT NEEDS office visit  #30 x 5   Entered by:   Orlan Leavens   Authorized by:   Newt Lukes MD   Signed by:   Orlan Leavens on 12/08/2009   Method used:   [facility]        [facility] (retail)       [address]       [place], [place]  [address]       Ph: [phone] or [phone]       Fax: [phone]   RxID:   [id number]

## 2010-12-10 NOTE — Progress Notes (Signed)
  Phone Note Refill Request   Refills Requested: Medication #1:  FUROSEMIDE 80 MG TABS Take 1 tablet by mouth two times a day Initial call taken by: Ami Bullins CMA,  December 08, 2009 1:35 PM    Prescriptions: FUROSEMIDE 80 MG TABS (FUROSEMIDE) Take 1 tablet by mouth two times a day  #60 Tablet x 3   Entered by:   Ami Bullins CMA   Authorized by:   Newt Lukes MD   Signed by:   Bill Salinas CMA on 12/08/2009   Method used:   [facility]        [facility]       [address]       [place], [place]  [address]       Ph: [phone] or [phone]       Fax: [phone]   RxID:   [id number]

## 2010-12-10 NOTE — Medication Information (Signed)
Summary: rov/ewj   Anticoagulant Therapy  Managed by: Cloyde Reams, RN, BSN Referring MD: Illene Regulus PCP: Newt Lukes MD Supervising MD: Jens Som MD, Arlys John Indication 1: Pulmonary embolus (ICD-415.19) Indication 2: Atrial Fibrillation (ICD-427.31) Lab Used: [facility] Site: [facility] INR POC 3.0 INR RANGE 2.0-3.0  Dietary changes: no    Health status changes: no    Bleeding/hemorrhagic complications: no    Recent/future hospitalizations: no    Any changes in medication regimen? no    Recent/future dental: no  Any missed doses?: yes     Details: missed 1 dosage on 08/09/10.   Is patient compliant with meds? yes       Allergies: No Known Drug Allergies  Anticoagulation Management History:      The patient is taking warfarin and comes in today for a routine follow up visit.  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  Negative risk factors for bleeding include an age less than 78 years old.  The bleeding index is 'intermediate risk'.  Positive CHADS2 values include History of CHF, History of HTN, and Prior Stroke/CVA/TIA.  Negative CHADS2 values include Age > 80 years old.  The start date was 01/08/2005.  Anticoagulation responsible Derius Ghosh: Jens Som MD, Arlys John.  INR POC: 3.0.  Cuvette Lot#: [id number].  Exp: 09/2011.    Anticoagulation Management Assessment/Plan:      The patient's current anticoagulation dose is Warfarin sodium 5 mg tabs: take 1 by mouth qd.  The target INR is 2.0-3.0.  The next INR is due 09/08/2010.  Anticoagulation instructions were given to patient.  Results were reviewed/authorized by Cloyde Reams, RN, BSN.  She was notified by Cloyde Reams RN.         Prior Anticoagulation Instructions: INR 2.5  Continue taking Coumadin 1 tab (5 mg) on Mon, Thur, Sat and Coumadin 0.5 tab (2.5 mg) on Sun, Tue, Wed, Fri. Return to clinic in 3 weeks.   Current Anticoagulation Instructions: INR 3.0  Continue on same dosage 1/2  tablet daily except 1 tablet on Mondays, Thursdays, and Saturdays.  Recheck in 4 weeks.     Medication Administration  Injection # 1:    Medication: Vit B12 1000 mcg    Diagnosis: Anemia    Route: IM    Site: L deltoid    Exp Date: 07/2011    Lot #: [id number]    Mfr: American Regent    Patient tolerated injection without complications    Given by: Cloyde Reams RN (August 11, 2010 12:12 PM)

## 2010-12-10 NOTE — Medication Information (Signed)
Summary: ROV/SP   Anticoagulant Therapy  Managed by: Geoffry Paradise, PharmD Referring MD: Illene Regulus PCP: Newt Lukes MD Supervising MD: Tenny Craw MD, Gunnar Fusi Indication 1: Pulmonary embolus (ICD-415.19) Indication 2: Atrial Fibrillation (ICD-427.31) Lab Used: [facility] Site: [facility] INR POC 1.9 INR RANGE 2.0-3.0  Dietary changes: no    Health status changes: no    Bleeding/hemorrhagic complications: no    Recent/future hospitalizations: no    Any changes in medication regimen? no    Recent/future dental: no  Any missed doses?: no       Is patient compliant with meds? yes       Allergies: No Known Drug Allergies  Anticoagulation Management History:      Positive risk factors for bleeding include history of CVA/TIA.  Negative risk factors for bleeding include an age less than 15 years old.  The bleeding index is 'intermediate risk'.  Positive CHADS2 values include History of CHF, History of HTN, and Prior Stroke/CVA/TIA.  Negative CHADS2 values include Age > 98 years old.  The start date was 01/08/2005.  Anticoagulation responsible provider: Tenny Craw MD, Gunnar Fusi.  INR POC: 1.9.  Cuvette Lot#: [id number].  Exp: 11/2011.    Anticoagulation Management Assessment/Plan:      The patient's current anticoagulation dose is Warfarin sodium 5 mg tabs: take 1 by mouth once daily.  The target INR is 2.0-3.0.  The next INR is due 12/15/2010.  Anticoagulation instructions were given to patient.  Results were reviewed/authorized by Geoffry Paradise, PharmD.         Prior Anticoagulation Instructions: INR 1.4   Coumadin 5 mg tablets - Take a whole tablet today, then continue 1/2 tablet every day except 1 tablet on Mondays, Thursdays, and Saturdays   Current Anticoagulation Instructions: INR:  1.9  (goal 2-3)  Your INR is 1.9 today.  Your INR were low for the past few visits but due to missed doses and large amount of green leafy vegetables.  Continue taking your coumadin at 1/2 a  tablet everyday except 1 full tablet on Monday, Thursdays and Saturdays.  Cut back on 1-2 servings of green leafy vegetables today and/or tomorrow.  Return to clinic in 3 weeks for another INR check.     Medication Administration  Injection # 1:    Medication: Cyanocobalamin    Route: IM    Site: R deltoid    Exp Date: 07/2011    Lot #: [id number]    Mfr: American Regent    Patient tolerated injection without complications    Given by: Geoffry Paradise PharmD (November 24, 2010 11:39 AM)

## 2010-12-10 NOTE — Assessment & Plan Note (Signed)
Summary: wart r foot/cd   Vital Signs:  Patient profile:   65 year old female Height:      64 inches (162.56 cm) Weight:      170.2 pounds (77.36 kg) O2 Sat:      93 % on Room air Temp:     97.2 degrees F (36.22 degrees C) oral Pulse rate:   64 / minute BP sitting:   90 / 62  (right arm) Cuff size:   regular  Vitals Entered By: Orlan Leavens (Mar 10, 2010 11:10 AM)  O2 Flow:  Room air CC: wart on (R) foot Is Patient Diabetic? No Pain Assessment Patient in pain? no        Primary Care Provider:  Newt Lukes MD  CC:  wart on (R) foot.  History of Present Illness: c/o pain in right sole related to wart - getting larger and more painful - not prev tx, not using OTC tx for same  alos c/o mid thoracic pain, radiates around both sides to front - onset 4 months ago - no fall or precipating injury no rash - no weight loss pain worst when lying in bed or turning over in bed pain "comes on quick and takes my breath away", then resolves spontaneously with rest   Current Medications (verified): 1)  Furosemide 80 Mg Tabs (Furosemide) .... Take 1 Tablet By Mouth Two Times A Day 2)  Gemfibrozil 600 Mg Tabs (Gemfibrozil) .... 2 By Mouth Once Daily 3)  Klor-Con M20 20 Meq Tbcr (Potassium Chloride Crys Cr) .... Take 1 Tablet By Mouth Once A Day 4)  Lisinopril 20 Mg Tabs (Lisinopril) .Marland Kitchen.. 1 Once Daily Pt Needs Office Visit 5)  Warfarin Sodium 5 Mg Tabs (Warfarin Sodium) .... Take 1 By Mouth Qd 6)  Metoprolol Succinate 100 Mg  Tb24 (Metoprolol Succinate) .... Take 1 Tablet By Mouth Once A Day 7)  Bupropion Hcl 100 Mg Tab (Bupropion Hcl) .Marland Kitchen.. 1 By Mouth Daily 8)  Combivent 103-18 Mcg/act Aero (Ipratropium-Albuterol) .... 2 Puffs Every 4 Hours As Needed For Breathing 9)  Coumadin 5 Mg Tabs (Warfarin Sodium) .... As Directed  Allergies (verified): No Known Drug Allergies  Past History:  Past Medical History: Rheumatic fever-mild mitral regurg Asthma Congestive heart  failure-last echo Jan 17,'06 EF 35-45% Hyperlipidemia Hypertension Pulmonary embolism, hx of-2001 Cerebrovascular accident, hx of 1995 with L hemiparesis Atrial fib, chronic anticoag at [facility]  Physician roster:  [facility] cards - [facility]  Review of Systems  The patient denies anorexia, fever, weight loss, chest pain, syncope, headaches, and suspicious skin lesions.    Physical Exam  General:  overweight-appearing.  alert, well-developed, well-nourished, and cooperative to examination.    Lungs:  normal respiratory effort, no intercostal retractions or use of accessory muscles; normal breath sounds bilaterally - no crackles and no wheezes.    Heart:  normal rate, regular rhythm, no murmur, and no rub. BLE without edema. Msk:  back: full range of motion of lumbar spine. mildly tender to palpation over lower t-spine vert. + bilateral straight leg raise. Deep tendon reflexes symmetrically intact at Achilles and patella, negative clonus. Sensation intact throughout all dermatomes in bilateral lower extremities. Full strength to manual muscle testing in all major muscule groups - Able to heel and toe walk without difficulty Skin:  2 plantart warts over ball of right foot   Impression & Recommendations:  Problem # 1:  PLANTAR WART, RIGHT (WUJ-811.91)  Orders: Podiatry Referral (Podiatry)  Problem #  2:  BACK PAIN, THORACIC REGION (ICD-724.1)  Orders: T-Thoracic Spine 2 Views [phone])  Discussed use of moist heat or ice, modified activities, medications, and stretching/strengthening exercises.  Back care instructions given. To be seen in 2 weeks if no improvement; sooner if worsening of symptoms.  consider referral to PT and use of muscle relaxants depending on xray report  Complete Medication List: 1)  Furosemide 80 Mg Tabs (Furosemide) .... Take 1 tablet by mouth two times a day 2)  Gemfibrozil 600 Mg Tabs (Gemfibrozil) .... 2 by mouth once daily 3)  Klor-con M20 20 Meq Tbcr  (Potassium chloride crys cr) .... Take 1 tablet by mouth once a day 4)  Lisinopril 20 Mg Tabs (Lisinopril) .Marland Kitchen.. 1 once daily pt needs office visit 5)  Warfarin Sodium 5 Mg Tabs (Warfarin sodium) .... Take 1 by mouth qd 6)  Metoprolol Succinate 100 Mg Tb24 (Metoprolol succinate) .... Take 1 tablet by mouth once a day 7)  Bupropion Hcl 100 Mg Tab (Bupropion hcl) .Marland Kitchen.. 1 by mouth daily 8)  Combivent 103-18 Mcg/act Aero (Ipratropium-albuterol) .... 2 puffs every 4 hours as needed for breathing 9)  Coumadin 5 Mg Tabs (Warfarin sodium) .... As directed  Patient Instructions: 1)  it was good to see you today. 2)  test(s) ordered today - your results will be called to you in 48-72 hours from the time of test completion 3)  we'll make referral to podiatry for your plantar wart treatment on right foot. Our office will contact you regarding this appointment once made.  4)  Please schedule a follow-up appointment in 3 months, sooner if problems.

## 2010-12-10 NOTE — Medication Information (Signed)
Summary: rov/sp  Anticoagulant Therapy  Managed by: Weston Brass, PharmD Referring MD: Illene Regulus PCP: Newt Lukes MD Supervising MD: Gala Romney MD, Reuel Boom Indication 1: Pulmonary embolus (ICD-415.19) Indication 2: Atrial Fibrillation (ICD-427.31) Lab Used: [facility] Site: [facility] INR POC 3.4 INR RANGE 2.0-3.0  Dietary changes: no    Health status changes: no    Bleeding/hemorrhagic complications: no    Recent/future hospitalizations: no    Any changes in medication regimen? no    Recent/future dental: no  Any missed doses?: no       Is patient compliant with meds? yes       Allergies: No Known Drug Allergies  Anticoagulation Management History:      The patient is taking warfarin and comes in today for a routine follow up visit.  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  Negative risk factors for bleeding include an age less than 41 years old.  The bleeding index is 'intermediate risk'.  Positive CHADS2 values include History of CHF, History of HTN, and Prior Stroke/CVA/TIA.  Negative CHADS2 values include Age > 36 years old.  The start date was 01/08/2005.  Anticoagulation responsible provider: Alcie Runions MD, Reuel Boom.  INR POC: 3.4.  Cuvette Lot#: [id number].  Exp: 06/2011.    Anticoagulation Management Assessment/Plan:      The patient's current anticoagulation dose is Warfarin sodium 5 mg tabs: take 1 by mouth qd, Coumadin 5 mg tabs: as directed.  The target INR is 2.0-3.0.  The next INR is due 04/14/2010.  Anticoagulation instructions were given to patient.  Results were reviewed/authorized by Weston Brass, PharmD.  She was notified by Weston Brass PharmD.         Prior Anticoagulation Instructions: INR 4.3. Hold Coumadin today, then take 0.5 tablet daily except 1 tablet on Mon, Wed, Fri.  Recheck in 7-14 days.  Current Anticoagulation Instructions: INR 3.4  Skip today's dose of Coumadin then start new dose of 1/2 tablet every day except  1 tablet on Monday and Friday    Medication Administration  Injection # 1:    Medication: Vit B12 1000 mcg    Route: IM    Site: R deltoid    Exp Date: 07/09/2010    Lot #: [id number]    Mfr: American Regent    Patient tolerated injection without complications    Given by: Weston Brass PharmD (Mar 31, 2010 3:38 PM)

## 2010-12-10 NOTE — Medication Information (Signed)
Summary: rov/tm  Anticoagulant Therapy  Managed by: Weston Brass, PharmD Referring MD: Illene Regulus PCP: Newt Lukes MD Supervising MD: Tenny Craw MD, Gunnar Fusi Indication 1: Pulmonary embolus (ICD-415.19) Indication 2: Atrial Fibrillation (ICD-427.31) Lab Used: [facility] Site: [facility] INR POC 1.4 INR RANGE 2.0-3.0  Dietary changes: no    Health status changes: no    Bleeding/hemorrhagic complications: no    Recent/future hospitalizations: no    Any changes in medication regimen? no    Recent/future dental: no  Any missed doses?: yes     Details: Tuesday 2/3  Is patient compliant with meds? yes       Allergies: No Known Drug Allergies  Anticoagulation Management History:      The patient is taking warfarin and comes in today for a routine follow up visit.  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  Negative risk factors for bleeding include an age less than 34 years old.  The bleeding index is 'intermediate risk'.  Positive CHADS2 values include History of CHF, History of HTN, and Prior Stroke/CVA/TIA.  Negative CHADS2 values include Age > 58 years old.  The start date was 01/08/2005.  Anticoagulation responsible provider: Tenny Craw MD, Gunnar Fusi.  INR POC: 1.4.  Cuvette Lot#: [id number].  Exp: 12/2011.    Anticoagulation Management Assessment/Plan:      The patient's current anticoagulation dose is Warfarin sodium 5 mg tabs: take 1 by mouth once daily.  The target INR is 2.0-3.0.  The next INR is due 11/24/2010.  Anticoagulation instructions were given to patient.  Results were reviewed/authorized by Weston Brass, PharmD.  She was notified by Stephannie Peters, PharmD Candidate .         Prior Anticoagulation Instructions: INR 1.7 Today take 5mg s then change dose to 2.5mg s everyday except 5mg s on Mondays, Thursdays and Saturdays. Recheck in 3 weeks.   Current Anticoagulation Instructions: INR 1.4   Coumadin 5 mg tablets - Take a whole tablet today, then  continue 1/2 tablet every day except 1 tablet on Mondays, Thursdays, and Saturdays

## 2010-12-10 NOTE — Letter (Signed)
Summary: [facility]  Coumadin  [address]   [place], [place] [address]   Phone: [phone]  Fax: [phone]     November 26, 2009 MRN: [id number]   [facility] [address] [place], [place]  [address]   Dear Ms. Roedel,  This letter is being sent to you as a reminder that it is necessary for you to get your INR/PT checked regularly so that we can optimize your care.  Our records indicate that you were scheduled to have a test done recently.  As of today, we have not received the results of this test.  It is very important that you have your INR checked.  Please call our office at the number listed above to schedule an appointment at your earliest convenience.    If you have recently had your protime checked or have discontinued this medication, please contact our office at the above phone number to clarify this issue.  Thank you for this prompt attention to this important health care matter.  Sincerely,   [facility] Team

## 2010-12-10 NOTE — Assessment & Plan Note (Signed)
Summary: 4-6 MO ROV /NWS  TO COME FASTING   #   Vital Signs:  Patient profile:   65 year old female Height:      64 inches (162.56 cm) Weight:      160.0 pounds (72.73 kg) BMI:     27.56 O2 Sat:      98 % on Room air Temp:     97.3 degrees F (36.28 degrees C) oral Pulse rate:   58 / minute BP sitting:   82 / 52  (right arm) Cuff size:   regular  Vitals Entered By: Orlan Leavens RMA (September 22, 2010 9:11 AM)  O2 Flow:  Room air CC: 6 month follow-up Is Patient Diabetic? No Pain Assessment Patient in pain? no        Primary Care Ulas Zuercher:  Newt Lukes MD  CC:  6 month follow-up.  History of Present Illness: here for followup  COPD -quit smoking but resumed spring 2011 has chantix at home and planning to quit 2cig/d before the end of the year -   A fib - reports compliance with ongoing medical treatment and no changes in medication dose or frequency. denies adverse side effects related to current therapy. follows with [facility] for same - no SOB or palp or edema  depression - exac by stress  with family-  currently improved and no longer taking meds - no sadness, more energy   HTN - reports compliance with ongoing medical treatment and no changes in medication dose or frequency. denies adverse side effects related to current therapy.  no edema, cp or ha - feels tired and occ dizzy    Clinical Review Panels:  Immunizations   Last Flu Vaccine:  Historical given @ [facility] (08/08/2010)   Last Pneumovax:  Pneumovax (Medicare) (09/22/2010)  Lipid Management   Cholesterol:  182 (07/29/2009)   LDL (bad choesterol):  126 (07/29/2009)   HDL (good cholesterol):  40.00 (07/29/2009)  CBC   WBC:  6.3 (07/29/2009)   RBC:  4.41 (07/29/2009)   Hgb:  14.2 (07/29/2009)   Hct:  41.0 (07/29/2009)   Platelets:  217.0 (07/29/2009)   MCV  93.0 (07/29/2009)   MCHC  34.6 (07/29/2009)   RDW  12.4 (07/29/2009)   PMN:  72.6 (07/29/2009)   Lymphs:  18.8 (07/29/2009)   Monos:   5.8 (07/29/2009)   Eosinophils:  2.2 (07/29/2009)   Basophil:  0.6 (07/29/2009)  Complete Metabolic Panel   Glucose:  94 (07/29/2009)   Sodium:  142 (07/29/2009)   Potassium:  3.8 (07/29/2009)   Chloride:  103 (07/29/2009)   CO2:  33 (07/29/2009)   BUN:  12 (07/29/2009)   Creatinine:  0.7 (07/29/2009)   Albumin:  3.9 (09/15/2007)   Total Protein:  7.2 (09/15/2007)   Calcium:  9.4 (07/29/2009)   Total Bili:  0.7 (09/15/2007)   Alk Phos:  123 (09/15/2007)   SGPT (ALT):  18 (09/15/2007)   SGOT (AST):  22 (09/15/2007)   Current Medications (verified): 1)  Furosemide 80 Mg Tabs (Furosemide) .... Take 1 Tablet By Mouth Two Times A Day 2)  Gemfibrozil 600 Mg Tabs (Gemfibrozil) .... 2 By Mouth Once Daily 3)  Klor-Con M20 20 Meq Tbcr (Potassium Chloride Crys Cr) .... Take 1 Tablet By Mouth Once A Day 4)  Lisinopril 20 Mg Tabs (Lisinopril) .Marland Kitchen.. 1 Once Daily Pt Needs Office Visit 5)  Warfarin Sodium 5 Mg Tabs (Warfarin Sodium) .... Take 1 By Mouth Qd 6)  Metoprolol Succinate 100 Mg  Tb24 (Metoprolol Succinate) .... Take 1 Tablet By Mouth Once A Day 7)  Combivent 103-18 Mcg/act Aero (Ipratropium-Albuterol) .... 2 Puffs Every 4 Hours As Needed For Breathing 8)  Robaxin 500 Mg Tabs (Methocarbamol) .Marland Kitchen.. 1 By Mouth Every 8 Hours As Needed For Muscle Spasm Pain  Allergies (verified): No Known Drug Allergies  Past History:  Past Medical History: Rheumatic fever-mild mitral regurg Asthma Congestive heart failure-last echo Jan 17,'06 EF 35-45% Hyperlipidemia Hypertension  Pulmonary embolism, hx of-2001 Cerebrovascular accident, hx of 1995 with L hemiparesis Atrial fib, chronic anticoag at [facility]   Physician roster:   Amie Critchley  cards - [facility]  Review of Systems  The patient denies weight gain, chest pain, and headaches.    Physical Exam  General:  overweight-appearing.  alert, well-developed, well-nourished, and cooperative to examination.    Lungs:  normal respiratory effort, no  intercostal retractions or use of accessory muscles; normal breath sounds bilaterally - no crackles and no wheezes.    Heart:  normal rate, irregular rhythm, no murmur, and no rub. BLE without edema. (laterally fatty ankles chronic)   Impression & Recommendations:  Problem # 1:  HYPERTENSION (ICD-401.9)  overtx and dizzy symptoms - stope ACEI and reduce diuretic dose -  check Bmet The following medications were removed from the medication list:    Lisinopril 20 Mg Tabs (Lisinopril) .Marland Kitchen... 1 once daily pt needs office visit Her updated medication list for this problem includes:    Furosemide 80 Mg Tabs (Furosemide) .Marland Kitchen... Take 1 tablet by mouth two times a day (or as directed)    Metoprolol Succinate 100 Mg Tb24 (Metoprolol succinate) .Marland Kitchen... Take 1 tablet by mouth once a day  Orders: TLB-BMP (Basic Metabolic Panel-BMET) (80048-METABOL)  BP today: 82/52 Prior BP: 130/82 (05/19/2010)  Labs Reviewed: K+: 3.8 (07/29/2009) Creat: : 0.7 (07/29/2009)   Chol: 182 (07/29/2009)   HDL: 40.00 (07/29/2009)   LDL: 126 (07/29/2009)   TG: 81.0 (07/29/2009)  Problem # 2:  ATRIAL FIBRILLATION (ICD-427.31)  Her updated medication list for this problem includes:    Warfarin Sodium 5 Mg Tabs (Warfarin sodium) .Marland Kitchen... Take 1 by mouth once daily    Metoprolol Succinate 100 Mg Tb24 (Metoprolol succinate) .Marland Kitchen... Take 1 tablet by mouth once a day  Reviewed the following: PT: 19.2 (05/26/2009)    Coumadin Dose (weekly): 22.50 mg (09/08/2010) Prior Coumadin Dose (weekly): 22.50 mg (09/08/2010) Next Protime: 09/29/2010 (dated on 09/08/2010)  Problem # 3:  CHRONIC OBSTRUCTIVE PULMONARY DISEASE (ICD-496)  Her updated medication list for this problem includes:    Combivent 103-18 Mcg/act Aero (Ipratropium-albuterol) .Marland Kitchen... 2 puffs every 4 hours as needed for breathing  cont current tx - no active symptoms or flares encouraged to quit smoking again  Pulmonary Functions Reviewed: O2 sat: 98 (09/22/2010)      Vaccines Reviewed: Flu Vax: Historical given @ [facility] (08/08/2010)  Problem # 4:  HYPERLIPIDEMIA (ICD-272.4)  Her updated medication list for this problem includes:    Gemfibrozil 600 Mg Tabs (Gemfibrozil) .Marland Kitchen... 2 by mouth once daily  Orders: TLB-Lipid Panel (80061-LIPID)  Labs Reviewed: SGOT: 22 (09/15/2007)   SGPT: 18 (09/15/2007)   HDL:40.00 (07/29/2009), 37.4 (09/15/2007)  LDL:126 (07/29/2009), 129 (09/15/2007)  Chol:182 (07/29/2009), 199 (09/15/2007)  Trig:81.0 (07/29/2009), 165 (09/15/2007)  Complete Medication List: 1)  Furosemide 80 Mg Tabs (Furosemide) .... Take 1 tablet by mouth two times a day (or as directed) 2)  Gemfibrozil 600 Mg Tabs (Gemfibrozil) .... 2 by mouth once daily 3)  Klor-con M20 20  Meq Tbcr (Potassium chloride crys cr) .... Take 1 tablet by mouth once a day 4)  Warfarin Sodium 5 Mg Tabs (Warfarin sodium) .... Take 1 by mouth once daily 5)  Metoprolol Succinate 100 Mg Tb24 (Metoprolol succinate) .... Take 1 tablet by mouth once a day 6)  Combivent 103-18 Mcg/act Aero (Ipratropium-albuterol) .... 2 puffs every 4 hours as needed for breathing 7)  Robaxin 500 Mg Tabs (Methocarbamol) .Marland Kitchen.. 1 by mouth every 8 hours as needed for muscle spasm pain  Other Orders: T-Bone Densitometry (16109) T-Lumbar Vertebral Assessment (60454) Pneumococcal Vaccine (09811) Admin 1st Vaccine (91478)  Patient Instructions: 1)  it was good to see you today. 2)  stop lisinopril and reduce furosemide due to low blood pressure - no other medicine changes 3)  test(s) ordered today - your results will be posted on the phone tree for review in 48-72 hours from the time of test completion; call [phone] and enter your 9 digit MRN (listed above on this page, just below your name); if any changes need to be made or there are abnormal results, you will be contacted directly. 4)  we'll make referral for bone density test. Our office will contact you regarding this appointment once  made.  5)  pneumovax today 6)  Please schedule a follow-up appointment in 3 months to check bloos pressure, call sooner if problems.    Orders Added: 1)  TLB-Lipid Panel [80061-LIPID] 2)  TLB-BMP (Basic Metabolic Panel-BMET) [80048-METABOL] 3)  Est. Patient Level IV [57846] 4)  T-Bone Densitometry [77080] 5)  T-Lumbar Vertebral Assessment [77082] 6)  Pneumococcal Vaccine [90732] 7)  Admin 1st Vaccine [96295]   Immunization History:  Influenza Immunization History:    Influenza:  historical given @ [facility] (08/08/2010)  Immunizations Administered:  Pneumonia Vaccine:    Vaccine Type: Pneumovax (Medicare)    Site: right deltoid    Mfr: Merck    Dose: 0.5 ml    Route: IM    Given by: Orlan Leavens RMA    Exp. Date: 03/04/2012    Lot #: [id number]    VIS given: 09/22/10   Immunization History:  Influenza Immunization History:    Influenza:  Historical given @ [facility] (08/08/2010)  Immunizations Administered:  Pneumonia Vaccine:    Vaccine Type: Pneumovax (Medicare)    Site: right deltoid    Mfr: Merck    Dose: 0.5 ml    Route: IM    Given by: Orlan Leavens RMA    Exp. Date: 03/04/2012    Lot #: [id number]    VIS given: 09/22/10

## 2010-12-10 NOTE — Medication Information (Signed)
Summary: rov/sl   Anticoagulant Therapy  Managed by: Reina Fuse, PharmD Referring MD: Illene Regulus PCP: Newt Lukes MD Supervising MD: Daleen Squibb MD, Maisie Fus Indication 1: Pulmonary embolus (ICD-415.19) Indication 2: Atrial Fibrillation (ICD-427.31) Lab Used: [facility] Site: [facility] INR POC 2.5 INR RANGE 2.0-3.0  Dietary changes: no    Health status changes: no    Bleeding/hemorrhagic complications: no    Recent/future hospitalizations: no    Any changes in medication regimen? no    Recent/future dental: no  Any missed doses?: no       Is patient compliant with meds? yes       Allergies: No Known Drug Allergies  Anticoagulation Management History:      The patient is taking warfarin and comes in today for a routine follow up visit.  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  Negative risk factors for bleeding include an age less than 68 years old.  The bleeding index is 'intermediate risk'.  Positive CHADS2 values include History of CHF, History of HTN, and Prior Stroke/CVA/TIA.  Negative CHADS2 values include Age > 62 years old.  The start date was 01/08/2005.  Anticoagulation responsible provider: Daleen Squibb MD, Maisie Fus.  INR POC: 2.5.  Cuvette Lot#: [id number].  Exp: 07/2011.    Anticoagulation Management Assessment/Plan:      The patient's current anticoagulation dose is Warfarin sodium 5 mg tabs: take 1 by mouth qd.  The target INR is 2.0-3.0.  The next INR is due 07/21/2010.  Anticoagulation instructions were given to patient.  Results were reviewed/authorized by Reina Fuse, PharmD.  She was notified by Reina Fuse PharmD.         Prior Anticoagulation Instructions: INR 1.6  Take Coumadin 1 tab (5 mg) tonight.  Then, take Coumadin 0.5 tab (2.5 mg) on Sun, Tues, Wed, Fri and Coumadin 1 tab (5 mg) on Mon, Thur, Sat. Return to clinic in 3 weeks.   Current Anticoagulation Instructions: INR 2.5  Continue taking Coumadin 1 tab (5 mg) on Mon, Thur,  Sat and Coumadin 0.5 tab (2.5 mg) on Sun, Tue, Wed, Fri. Return to clinic in 3 weeks.

## 2010-12-10 NOTE — Medication Information (Signed)
Summary: rov/ewj  Anticoagulant Therapy  Managed by: Eda Keys, PharmD Referring MD: Illene Regulus PCP: Newt Lukes MD Supervising MD: Gala Romney MD, Reuel Boom Indication 1: Pulmonary embolus (ICD-415.19) Indication 2: Atrial Fibrillation (ICD-427.31) Lab Used: [facility] Site: [facility] INR POC 2.8 INR RANGE 2.0-3.0  Dietary changes: no    Health status changes: no    Bleeding/hemorrhagic complications: no    Recent/future hospitalizations: no    Any changes in medication regimen? no    Recent/future dental: no  Any missed doses?: no       Is patient compliant with meds? yes       Allergies: No Known Drug Allergies  Anticoagulation Management History:      The patient is taking warfarin and comes in today for a routine follow up visit.  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  Negative risk factors for bleeding include an age less than 16 years old.  The bleeding index is 'intermediate risk'.  Positive CHADS2 values include History of CHF, History of HTN, and Prior Stroke/CVA/TIA.  Negative CHADS2 values include Age > 58 years old.  The start date was 01/08/2005.  Anticoagulation responsible provider: Coline Calkin MD, Reuel Boom.  INR POC: 2.8.  Cuvette Lot#: [id number].  Exp: 03/2011.    Anticoagulation Management Assessment/Plan:      The patient's current anticoagulation dose is Warfarin sodium 5 mg tabs: take 1 by mouth qd, Coumadin 5 mg tabs: as directed.  The target INR is 2.0-3.0.  The next INR is due 01/30/2010.  Anticoagulation instructions were given to patient.  Results were reviewed/authorized by Eda Keys, PharmD.  She was notified by Eda Keys.         Prior Anticoagulation Instructions: INR 2.7  Continue on same dosage 1 tablet daily except 1/2 tablet on Sundays and Thursdays.  Recheck in 3 weeks.    Current Anticoagulation Instructions: INR 2.8  Continue current dosing schedule of 1/2 tablet on Sunday and Thursday  and 1 tablet all other days.  Return to clinic in 4 weeks.

## 2010-12-10 NOTE — Assessment & Plan Note (Signed)
Summary: 3 MO ROV /NWS   Vital Signs:  Patient profile:   65 year old female Height:      64 inches (162.56 cm) Weight:      164.4 pounds (74.73 kg) BMI:     28.32 O2 Sat:      95 % on Room air Temp:     98.3 degrees F (36.83 degrees C) oral Pulse rate:   75 / minute BP sitting:   110 / 72  (left arm) Cuff size:   regular  Vitals Entered By: Orlan Leavens (December 16, 2009 4:02 PM)  O2 Flow:  Room air CC: 3 month follow-up/ aslo pt req form to be completed Is Patient Diabetic? No Pain Assessment Patient in pain? no        Primary Care Provider:  Newt Lukes MD  CC:  3 month follow-up/ aslo pt req form to be completed.  History of Present Illness: needs help completing forms for social security to [facility] disabilty coverage -  COPD -quit smoking and has not resumed > 3mos  a/w sig weight gain in the process! feels ache in her back and hips related to this weight gain - but no DOE, CP or SOB - no cough  A fib - reports compliance with ongoing medical treatment and no changes in medication dose or frequency. denies adverse side effects related to current therapy. follows with [facility] for same  depression - exac by stress with son involved in MVA-  also upset re: her delay in completion of disability forms but reports compliance with ongoing medical treatment and no changes in medication dose or frequency. denies adverse side effects related to current therapy.    Current Medications (verified): 1)  Furosemide 80 Mg Tabs (Furosemide) .... Take 1 Tablet By Mouth Two Times A Day 2)  Gemfibrozil 600 Mg Tabs (Gemfibrozil) .... 2 By Mouth Once Daily 3)  Klor-Con M20 20 Meq Tbcr (Potassium Chloride Crys Cr) .... Take 1 Tablet By Mouth Once A Day 4)  Lisinopril 20 Mg Tabs (Lisinopril) .Marland Kitchen.. 1 Once Daily Pt Needs Office Visit 5)  Warfarin Sodium 5 Mg Tabs (Warfarin Sodium) .... Take 1 By Mouth Qd 6)  Metoprolol Succinate 100 Mg  Tb24 (Metoprolol Succinate) .... Take 1  Tablet By Mouth Once A Day 7)  Bupropion Hcl 100 Mg Tab (Bupropion Hcl) .Marland Kitchen.. 1 By Mouth Daily 8)  Combivent 103-18 Mcg/act Aero (Ipratropium-Albuterol) .... 2 Puffs Every 4 Hours As Needed For Breathing 9)  Coumadin 5 Mg Tabs (Warfarin Sodium) .... As Directed  Allergies (verified): No Known Drug Allergies  Past History:  Past Medical History: Rheumatic fever-mild mitral regurg Asthma Congestive heart failure-last echo Jan 17,'06 EF 35-45% Hyperlipidemia Hypertension Pulmonary embolism, hx of-2001 Cerebrovascular accident, hx of 1995 with L hemiparesis Atrial fib, chronic anticoag at [facility]  Physician roster:  Amie Critchley cards - [facility]  Review of Systems       The patient complains of weight gain.  The patient denies anorexia, fever, chest pain, syncope, headaches, hemoptysis, difficulty walking, and depression.    Physical Exam  General:  overweight-appearing.  alert, well-developed, well-nourished, and cooperative to examination.    Lungs:  normal respiratory effort, no intercostal retractions or use of accessory muscles; normal breath sounds bilaterally - no crackles and no wheezes.    Heart:  normal rate, regular rhythm, no murmur, and no rub. BLE without edema. Skin:  roseca of nose and cheeks Psych:  brighter affect - good  eye contact today. Oriented X3, not anxious appearing, and not depressed appearing.     Impression & Recommendations:  Problem # 1:  CHRONIC OBSTRUCTIVE PULMONARY DISEASE (ICD-496) Assessment Improved  cont current tx - symptoms are improved congrats on being done with smoking  Her updated medication list for this problem includes:    Combivent 103-18 Mcg/act Aero (Ipratropium-albuterol) .Marland Kitchen... 2 puffs every 4 hours as needed for breathing  Problem # 2:  ATRIAL FIBRILLATION (ICD-427.31)  Her updated medication list for this problem includes:    Warfarin Sodium 5 Mg Tabs (Warfarin sodium) .Marland Kitchen... Take 1 by mouth qd    Metoprolol Succinate 100 Mg  Tb24 (Metoprolol succinate) .Marland Kitchen... Take 1 tablet by mouth once a day    Coumadin 5 Mg Tabs (Warfarin sodium) .Marland Kitchen... As directed  Reviewed the following: PT: 19.2 (05/26/2009)    Coumadin Dose (weekly): 30 mg (12/12/2009) Prior Coumadin Dose (weekly): 30 mg (12/12/2009) Next Protime: 01/02/2010 (dated on 12/12/2009)  Problem # 3:  HYPERTENSION (ICD-401.9)  Her updated medication list for this problem includes:    Furosemide 80 Mg Tabs (Furosemide) .Marland Kitchen... Take 1 tablet by mouth two times a day    Lisinopril 20 Mg Tabs (Lisinopril) .Marland Kitchen... 1 once daily pt needs office visit    Metoprolol Succinate 100 Mg Tb24 (Metoprolol succinate) .Marland Kitchen... Take 1 tablet by mouth once a day  BP today: 110/72 Prior BP: 112/70 (09/16/2009)  Labs Reviewed: K+: 3.8 (07/29/2009) Creat: : 0.7 (07/29/2009)   Chol: 182 (07/29/2009)   HDL: 40.00 (07/29/2009)   LDL: 126 (07/29/2009)   TG: 81.0 (07/29/2009)  Problem # 4:  HYPERLIPIDEMIA (ICD-272.4)  Her updated medication list for this problem includes:    Gemfibrozil 600 Mg Tabs (Gemfibrozil) .Marland Kitchen... 2 by mouth once daily  Labs Reviewed: SGOT: 22 (09/15/2007)   SGPT: 18 (09/15/2007)   HDL:40.00 (07/29/2009), 37.4 (09/15/2007)  LDL:126 (07/29/2009), 129 (09/15/2007)  Chol:182 (07/29/2009), 199 (09/15/2007)  Trig:81.0 (07/29/2009), 165 (09/15/2007)  Problem # 5:  CEREBROVASCULAR ACCIDENT, HX OF (ICD-V12.50)  Stable with minimal residual weakness.  Plan: continue Warfarin.  Problem # 6:  DEPRESSION (ICD-311)  Her updated medication list for this problem includes:    Bupropion Hcl 100 Mg Tab (Bupropion hcl) .Marland Kitchen... 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 Take 1 by mouth qd    Coumadin 5 Mg Tabs (Warfarin sodium) .Marland Kitchen... As directed  Complete Medication List: 1)  Furosemide 80 Mg Tabs (Furosemide) .... Take 1 tablet by mouth two times a day 2)  Gemfibrozil 600 Mg Tabs (Gemfibrozil) .... 2 by mouth once daily 3)  Klor-con M20 20 Meq Tbcr (Potassium chloride crys cr) .... Take 1 tablet by mouth once a day 4)  Lisinopril 20 Mg Tabs (Lisinopril) .Marland Kitchen.. 1 once daily pt needs office visit 5)  Warfarin Sodium 5 Mg Tabs (Warfarin sodium) .... Take 1 by mouth qd 6)  Metoprolol Succinate 100 Mg Tb24 (Metoprolol succinate) .... Take 1 tablet by mouth once a day 7)  Bupropion Hcl 100 Mg Tab (Bupropion hcl) .Marland Kitchen.. 1 by mouth daily  8)  Combivent 103-18 Mcg/act Aero (Ipratropium-albuterol) .... 2 puffs every 4 hours as needed for breathing 9)  Coumadin 5 Mg Tabs (Warfarin sodium) .... As directed  Patient Instructions: 1)  it was good to see you today.  2)  copy of OV note from 09/16/09 given to you stating my medical opinion that you remain disabled to report to [facility] - 3)  it is important that you work on losing weight - monitor your diet and consume fewer calories such as less carbohydrates (sugar) and less fat. you also need to increase your physical activity level - start by walking for 10-20 minutes 3 times per week and work up to 30 minutes 4-5 times each week.  4)  Congrats on giving up the cigarattes! keep it up! no more smoking ever! 5)  no medication changes - keep doing what you are doing - 6)  Please schedule a follow-up appointment in 4-6 months, sooner if problems.

## 2010-12-10 NOTE — Medication Information (Signed)
Summary: rov/sp  Anticoagulant Therapy  Managed by: Weston Brass, PharmD Referring MD: Illene Regulus PCP: Newt Lukes MD Supervising MD: Clifton James MD, Cristal Deer Indication 1: Pulmonary embolus (ICD-415.19) Indication 2: Atrial Fibrillation (ICD-427.31) Lab Used: [facility] Site: [facility] INR POC 1.9 INR RANGE 2.0-3.0  Dietary changes: no    Health status changes: no    Bleeding/hemorrhagic complications: no    Recent/future hospitalizations: no    Any changes in medication regimen? no    Recent/future dental: no  Any missed doses?: yes     Details: may have missed one dose within last week due to trip to [place]  Is patient compliant with meds? yes       Allergies: No Known Drug Allergies  Anticoagulation Management History:      The patient is taking warfarin and comes in today for a routine follow up visit.  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  Negative risk factors for bleeding include an age less than 38 years old.  The bleeding index is 'intermediate risk'.  Positive CHADS2 values include History of CHF, History of HTN, and Prior Stroke/CVA/TIA.  Negative CHADS2 values include Age > 57 years old.  The start date was 01/08/2005.  Anticoagulation responsible provider: Clifton James MD, Cristal Deer.  INR POC: 1.9.  Cuvette Lot#: [id number].  Exp: 07/2011.    Anticoagulation Management Assessment/Plan:      The patient's current anticoagulation dose is Warfarin sodium 5 mg tabs: take 1 by mouth qd.  The target INR is 2.0-3.0.  The next INR is due 06/16/2010.  Anticoagulation instructions were given to patient.  Results were reviewed/authorized by Weston Brass, PharmD.  She was notified by Dillard Cannon.         Prior Anticoagulation Instructions: INR 2.0  Take 1 tablet today then resume same dose of 1/2 tablet every day except 1 tablet on Monday and Thursday.   Current Anticoagulation Instructions: INR 1.9  Take 1 tablet today then  resume normal regimen of 0.5 tab on Sunday, Tuesday, Wednesday, Friday, and Saturday and 1 tab on Monday and Thursday.  Re-check INR in 4 weeks.   Medication Administration  Injection # 1:    Medication: Vit B12 1000 mcg    Route: IM    Site: R deltoid    Exp Date: 07/2011    Lot #: [id number]    Mfr: [employer]    Patient tolerated injection without complications    Given by: Weston Brass, PharmD

## 2010-12-10 NOTE — Assessment & Plan Note (Signed)
Summary: 4-6 mos f/u // # / cd   Vital Signs:  Patient profile:   65 year old female Height:      64 inches (162.56 cm) Weight:      167.0 pounds (75.91 kg) O2 Sat:      96 % on Room air Temp:     98.1 degrees F (36.72 degrees C) oral Pulse rate:   106 / minute BP sitting:   130 / 82  (left arm) Cuff size:   regular  Vitals Entered By: Orlan Leavens (May 19, 2010 1:28 PM)  O2 Flow:  Room air CC: 4 month follow-up Is Patient Diabetic? No Pain Assessment Patient in pain? no        Primary Care Provider:  Newt Lukes MD  CC:  4 month follow-up.  History of Present Illness: here for followup  COPD -quit smoking but resumed 2 mos ago a/w sig weight gain in the process! feels ache in her back and hips related to this weight gain - but no DOE, CP or SOB - no cough  A fib - reports compliance with ongoing medical treatment and no changes in medication dose or frequency. denies adverse side effects related to current therapy. follows with [facility] for same - no SOB or palp or edema  depression - exac by stress  with family-  currently improved and no longer taking meds - no sadness, more energy   HTN - reports compliance with ongoing medical treatment and no changes in medication dose or frequency. denies adverse side effects related to current therapy.  no edema, cp or ha    Clinical Review Panels:  Lipid Management   Cholesterol:  182 (07/29/2009)   LDL (bad choesterol):  126 (07/29/2009)   HDL (good cholesterol):  40.00 (07/29/2009)  CBC   WBC:  6.3 (07/29/2009)   RBC:  4.41 (07/29/2009)   Hgb:  14.2 (07/29/2009)   Hct:  41.0 (07/29/2009)   Platelets:  217.0 (07/29/2009)   MCV  93.0 (07/29/2009)   MCHC  34.6 (07/29/2009)   RDW  12.4 (07/29/2009)   PMN:  72.6 (07/29/2009)   Lymphs:  18.8 (07/29/2009)   Monos:  5.8 (07/29/2009)   Eosinophils:  2.2 (07/29/2009)   Basophil:  0.6 (07/29/2009)  Complete Metabolic Panel   Glucose:  94 (07/29/2009)   Sodium:   142 (07/29/2009)   Potassium:  3.8 (07/29/2009)   Chloride:  103 (07/29/2009)   CO2:  33 (07/29/2009)   BUN:  12 (07/29/2009)   Creatinine:  0.7 (07/29/2009)   Albumin:  3.9 (09/15/2007)   Total Protein:  7.2 (09/15/2007)   Calcium:  9.4 (07/29/2009)   Total Bili:  0.7 (09/15/2007)   Alk Phos:  123 (09/15/2007)   SGPT (ALT):  18 (09/15/2007)   SGOT (AST):  22 (09/15/2007)   Current Medications (verified): 1)  Furosemide 80 Mg Tabs (Furosemide) .... Take 1 Tablet By Mouth Two Times A Day 2)  Gemfibrozil 600 Mg Tabs (Gemfibrozil) .... 2 By Mouth Once Daily 3)  Klor-Con M20 20 Meq Tbcr (Potassium Chloride Crys Cr) .... Take 1 Tablet By Mouth Once A Day 4)  Lisinopril 20 Mg Tabs (Lisinopril) .Marland Kitchen.. 1 Once Daily Pt Needs Office Visit 5)  Warfarin Sodium 5 Mg Tabs (Warfarin Sodium) .... Take 1 By Mouth Qd 6)  Metoprolol Succinate 100 Mg  Tb24 (Metoprolol Succinate) .... Take 1 Tablet By Mouth Once A Day 7)  Bupropion Hcl 100 Mg Tab (Bupropion Hcl) .Marland Kitchen.. 1 By Mouth  Daily 8)  Combivent 103-18 Mcg/act Aero (Ipratropium-Albuterol) .... 2 Puffs Every 4 Hours As Needed For Breathing 9)  Coumadin 5 Mg Tabs (Warfarin Sodium) .... As Directed 10)  Robaxin 500 Mg Tabs (Methocarbamol) .Marland Kitchen.. 1 By Mouth Every 8 Hours As Needed For Muscle Spasm Pain  Allergies (verified): No Known Drug Allergies  Past History:  Past Medical History: Rheumatic fever-mild mitral regurg Asthma Congestive heart failure-last echo Jan 17,'06 EF 35-45% Hyperlipidemia Hypertension Pulmonary embolism, hx of-2001 Cerebrovascular accident, hx of 1995 with L hemiparesis Atrial fib, chronic anticoag at [facility]  Physician roster:   Amie Critchley cards - [facility]  Review of Systems       The patient complains of weight gain.  The patient denies chest pain, syncope, headaches, and hemoptysis.    Physical Exam  General:  overweight-appearing.  alert, well-developed, well-nourished, and cooperative to examination.    Lungs:   normal respiratory effort, no intercostal retractions or use of accessory muscles; normal breath sounds bilaterally - no crackles and no wheezes.    Heart:  normal rate, irregular rhythm, no murmur, and no rub. BLE without edema. Psych:  Oriented X3, memory intact for recent and remote, normally interactive, good eye contact, not anxious appearing, not depressed appearing, and not agitated.      Impression & Recommendations:  Problem # 1:  ATRIAL FIBRILLATION (ICD-427.31)  The following medications were removed from the medication list:    Coumadin 5 Mg Tabs (Warfarin sodium) .Marland Kitchen... As directed Her updated medication list for this problem includes:    Warfarin Sodium 5 Mg Tabs (Warfarin sodium) .Marland Kitchen... Take 1 by mouth qd    Metoprolol Succinate 100 Mg Tb24 (Metoprolol succinate) .Marland Kitchen... Take 1 tablet by mouth once a day  Reviewed the following: PT: 19.2 (05/26/2009)    Coumadin Dose (weekly): 22.50 mg (04/21/2010) Prior Coumadin Dose (weekly): 22.50 mg (04/21/2010) Next Protime: 05/19/2010 (dated on 04/21/2010)  Problem # 2:  HYPERTENSION (ICD-401.9)  Her updated medication list for this problem includes:    Furosemide 80 Mg Tabs (Furosemide) .Marland Kitchen... Take 1 tablet by mouth two times a day    Lisinopril 20 Mg Tabs (Lisinopril) .Marland Kitchen... 1 once daily pt needs office visit    Metoprolol Succinate 100 Mg Tb24 (Metoprolol succinate) .Marland Kitchen... Take 1 tablet by mouth once a day  BP today: 130/82 Prior BP: 90/62 (03/10/2010)  Labs Reviewed: K+: 3.8 (07/29/2009) Creat: : 0.7 (07/29/2009)   Chol: 182 (07/29/2009)   HDL: 40.00 (07/29/2009)   LDL: 126 (07/29/2009)   TG: 81.0 (07/29/2009)  Problem # 3:  HYPERLIPIDEMIA (ICD-272.4)  Her updated medication list for this problem includes:    Gemfibrozil 600 Mg Tabs (Gemfibrozil) .Marland Kitchen... 2 by mouth once daily  Labs Reviewed: SGOT: 22 (09/15/2007)   SGPT: 18 (09/15/2007)   HDL:40.00 (07/29/2009), 37.4 (09/15/2007)  LDL:126 (07/29/2009), 129 (09/15/2007)   Chol:182 (07/29/2009), 199 (09/15/2007)  Trig:81.0 (07/29/2009), 165 (09/15/2007)  Problem # 4:  CHRONIC OBSTRUCTIVE PULMONARY DISEASE (ICD-496)  cont current tx - no active symptoms or flares encouraged to quit smoking again  Her updated medication list for this problem includes:    Combivent 103-18 Mcg/act Aero (Ipratropium-albuterol) .Marland Kitchen... 2 puffs every 4 hours as needed for breathing  Pulmonary Functions Reviewed: O2 sat: 96 (05/19/2010)     Problem # 5:  DEPRESSION (ICD-311) Assessment: Improved now off meds - watch for recurrenec The following medications were removed from the medication list:    Bupropion Hcl 100 Mg Tab (Bupropion hcl) .Marland Kitchen... 1 by mouth  daily  Complete Medication List: 1)  Furosemide 80 Mg Tabs (Furosemide) .... Take 1 tablet by mouth two times a day 2)  Gemfibrozil 600 Mg Tabs (Gemfibrozil) .... 2 by mouth once daily 3)  Klor-con M20 20 Meq Tbcr (Potassium chloride crys cr) .... Take 1 tablet by mouth once a day 4)  Lisinopril 20 Mg Tabs (Lisinopril) .Marland Kitchen.. 1 once daily pt needs office visit 5)  Warfarin Sodium 5 Mg Tabs (Warfarin sodium) .... Take 1 by mouth qd 6)  Metoprolol Succinate 100 Mg Tb24 (Metoprolol succinate) .... Take 1 tablet by mouth once a day 7)  Combivent 103-18 Mcg/act Aero (Ipratropium-albuterol) .... 2 puffs every 4 hours as needed for breathing 8)  Robaxin 500 Mg Tabs (Methocarbamol) .Marland Kitchen.. 1 by mouth every 8 hours as needed for muscle spasm pain  Other Orders: Misc. Referral (Misc. Ref)  Patient Instructions: 1)  it was good to see you today. 2)  we'll make referral for bone density screenong as discussed. Our office will contact you regarding this appointment once made.  3)  stop smoking - use nicotine replacment as needed  4)  medications and labs reviewed today - no medicine changes - keep doing what you are doing 5)  Please schedule a follow-up appointment in 4-6 months, sooner if problems. come fasting so we can check cholesterol  and other labs as needed

## 2010-12-15 ENCOUNTER — Encounter: Payer: Self-pay | Admitting: Cardiovascular Disease

## 2010-12-15 ENCOUNTER — Encounter (INDEPENDENT_AMBULATORY_CARE_PROVIDER_SITE_OTHER): Payer: Medicare Other

## 2010-12-15 DIAGNOSIS — I2699 Other pulmonary embolism without acute cor pulmonale: Secondary | ICD-10-CM

## 2010-12-15 DIAGNOSIS — Z7901 Long term (current) use of anticoagulants: Secondary | ICD-10-CM

## 2010-12-22 ENCOUNTER — Other Ambulatory Visit: Payer: Self-pay | Admitting: Internal Medicine

## 2010-12-22 ENCOUNTER — Other Ambulatory Visit: Payer: Medicare Other

## 2010-12-22 ENCOUNTER — Ambulatory Visit (INDEPENDENT_AMBULATORY_CARE_PROVIDER_SITE_OTHER): Payer: Medicare Other | Admitting: Internal Medicine

## 2010-12-22 ENCOUNTER — Encounter: Payer: Self-pay | Admitting: Internal Medicine

## 2010-12-22 DIAGNOSIS — J441 Chronic obstructive pulmonary disease with (acute) exacerbation: Secondary | ICD-10-CM

## 2010-12-22 DIAGNOSIS — I10 Essential (primary) hypertension: Secondary | ICD-10-CM

## 2010-12-22 DIAGNOSIS — E785 Hyperlipidemia, unspecified: Secondary | ICD-10-CM

## 2010-12-22 DIAGNOSIS — M810 Age-related osteoporosis without current pathological fracture: Secondary | ICD-10-CM

## 2010-12-22 DIAGNOSIS — Z79899 Other long term (current) drug therapy: Secondary | ICD-10-CM

## 2010-12-22 LAB — LIPID PANEL
LDL Cholesterol: 82 mg/dL (ref 0–99)
Total CHOL/HDL Ratio: 3

## 2010-12-22 LAB — BASIC METABOLIC PANEL
BUN: 16 mg/dL (ref 6–23)
CO2: 33 mEq/L — ABNORMAL HIGH (ref 19–32)
Chloride: 104 mEq/L (ref 96–112)
Creatinine, Ser: 0.6 mg/dL (ref 0.4–1.2)

## 2010-12-22 LAB — HEPATIC FUNCTION PANEL
Alkaline Phosphatase: 59 U/L (ref 39–117)
Bilirubin, Direct: 0.2 mg/dL (ref 0.0–0.3)

## 2010-12-23 ENCOUNTER — Encounter: Payer: Self-pay | Admitting: Internal Medicine

## 2010-12-23 ENCOUNTER — Telehealth: Payer: Self-pay | Admitting: Internal Medicine

## 2010-12-24 NOTE — Medication Information (Signed)
Summary: Coumadin Clinic   Anticoagulant Therapy  Managed by: Weston Brass, PharmD Referring MD: Illene Regulus PCP: Newt Lukes MD Supervising MD: Eden Emms MD, Theron Arista Indication 1: Pulmonary embolus (ICD-415.19) Indication 2: Atrial Fibrillation (ICD-427.31) Lab Used: [facility] Site: [facility] INR POC 1.8 INR RANGE 2.0-3.0  Dietary changes: no    Health status changes: no    Bleeding/hemorrhagic complications: no    Recent/future hospitalizations: no    Any changes in medication regimen? no    Recent/future dental: no  Any missed doses?: no       Is patient compliant with meds? yes       Allergies: No Known Drug Allergies  Anticoagulation Management History:      The patient is taking warfarin and comes in today for a routine follow up visit.  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  Negative risk factors for bleeding include an age less than 53 years old.  The bleeding index is 'intermediate risk'.  Positive CHADS2 values include History of CHF, History of HTN, and Prior Stroke/CVA/TIA.  Negative CHADS2 values include Age > 78 years old.  The start date was 01/08/2005.  Anticoagulation responsible provider: Eden Emms MD, Theron Arista.  INR POC: 1.8.  Cuvette Lot#: [id number].  Exp: 12/2011.    Anticoagulation Management Assessment/Plan:      The patient's current anticoagulation dose is Warfarin sodium 5 mg tabs: take 1 by mouth once daily.  The target INR is 2.0-3.0.  The next INR is due 12/29/2010.  Anticoagulation instructions were given to patient.  Results were reviewed/authorized by Weston Brass, PharmD.  She was notified by Margot Chimes PharmD Candidate.         Prior Anticoagulation Instructions: INR:  1.9  (goal 2-3)  Your INR is 1.9 today.  Your INR were low for the past few visits but due to missed doses and large amount of green leafy vegetables.  Continue taking your coumadin at 1/2 a tablet everyday except 1 full tablet on Monday,  Thursdays and Saturdays.  Cut back on 1-2 servings of green leafy vegetables today and/or tomorrow.  Return to clinic in 3 weeks for another INR check.    Current Anticoagulation Instructions: INR:  1.8  Take 1 tablet tonight then start your new dose of 1 tablet everyday except 1/2 tablet on Sundays, Wednesdays, and Fridays.  Recheck in 2 weeks.  Keep greens consistent.

## 2010-12-29 ENCOUNTER — Encounter (INDEPENDENT_AMBULATORY_CARE_PROVIDER_SITE_OTHER): Payer: Medicare Other

## 2010-12-29 ENCOUNTER — Encounter: Payer: Self-pay | Admitting: Cardiovascular Disease

## 2010-12-29 DIAGNOSIS — Z8679 Personal history of other diseases of the circulatory system: Secondary | ICD-10-CM

## 2010-12-29 DIAGNOSIS — Z7901 Long term (current) use of anticoagulants: Secondary | ICD-10-CM

## 2010-12-29 DIAGNOSIS — I69959 Hemiplegia and hemiparesis following unspecified cerebrovascular disease affecting unspecified side: Secondary | ICD-10-CM

## 2010-12-29 DIAGNOSIS — I4891 Unspecified atrial fibrillation: Secondary | ICD-10-CM

## 2010-12-29 DIAGNOSIS — Z86718 Personal history of other venous thrombosis and embolism: Secondary | ICD-10-CM

## 2010-12-29 DIAGNOSIS — I2699 Other pulmonary embolism without acute cor pulmonale: Secondary | ICD-10-CM

## 2010-12-30 NOTE — Progress Notes (Signed)
Summary: reclast order  Phone Note Outgoing Call   Call placed by: Orlan Leavens RMA,  December 23, 2010 10:20 AM Call placed to: Patient Summary of Call: MD order reclast injection. Called [facility] spoke with Inocencio Homes. Set appt up for 01/06/11 @ 11:00. Faxed orders over @ [phone]. Notified pt with appt Initial call taken by: Orlan Leavens RMA,  December 23, 2010 10:22 AM

## 2010-12-30 NOTE — Op Note (Signed)
Summary: Reclast infusion orders/[facility]  Reclast infusion orders/[facility]   Imported By: Sherian Rein 12/25/2010 13:58:16  _____________________________________________________________________  External Attachment:    Type:   Image     Comment:   External Document

## 2010-12-30 NOTE — Progress Notes (Signed)
Summary: furosemide  Phone Note Refill Request Message from:  Fax from Pharmacy on December 23, 2010 2:35 PM  Refills Requested: Medication #1:  FUROSEMIDE 80 MG TABS Take 1 tablet by mouth two times a day (or as directed) [facility]   Method Requested: Electronic Initial call taken by: Orlan Leavens RMA,  December 23, 2010 2:35 PM    Prescriptions: FUROSEMIDE 80 MG TABS (FUROSEMIDE) Take 1 tablet by mouth two times a day (or as directed)  #60 x 3   Entered by:   Orlan Leavens RMA   Authorized by:   Newt Lukes MD   Signed by:   Orlan Leavens RMA on 12/23/2010   Method used:   [facility]        [facility]  [address] [phone][facility] ([facility]       [address]       [place], [place]  [address]       Ph: [phone] or [phone]       Fax: [phone]   RxID:   [id number]

## 2010-12-30 NOTE — Assessment & Plan Note (Signed)
Summary: 3 MTH FU STC   Vital Signs:  Patient profile:   65 year old female Height:      64 inches (162.56 cm) Weight:      167.6 pounds (76.18 kg) O2 Sat:      93 % on Room air Temp:     98.1 degrees F (36.72 degrees C) oral Pulse rate:   83 / minute BP sitting:   118 / 62  (right arm) Cuff size:   large  Vitals Entered By: Orlan Leavens RMA (December 22, 2010 11:03 AM)  O2 Flow:  Room air CC: 3 month follow-up Is Patient Diabetic? No Pain Assessment Patient in pain? no        Primary Care Provider:  Newt Lukes MD  CC:  3 month follow-up.  History of Present Illness: here for followup  COPD - prev quit smoking but resumed spring 2011 still has chantix at home and planning to quit -stuck at 2cig/d  recent flare with SOB and chest congestion - onset 8 days ago - not improving with otc cold meds  dyslipidemia - resumed low dose stain 09/2010 - ongoing gemfib - reports compliance with ongoing medical treatment and no changes in medication dose or frequency. denies adverse side effects related to current therapy.   osteoporosis - dexa 09/2010 with -3.7 at spine - started fosamax for same and Ca+ D -  reports fair compliance with rx'd medical treatment and no changes in medication dose or frequency. denies adverse side effects related to current therapy but prev on reclast for ease (forgets to take med on sun)  A fib -chronic anticoag - reports compliance with ongoing medical treatment and no changes in medication dose or frequency. denies adverse side effects related to current therapy. follows with [facility] for same - no SOB or palp or edema  depression - exac by stress with family-  no longer taking meds - no sadness, more energy   HTN - reports compliance with ongoing medical treatment and no changes in medication dose or frequency. denies adverse side effects related to current therapy.  no edema, cp or ha - feels tired and occ dizzy    Clinical Review  Panels:  Lipid Management   Cholesterol:  213 (09/22/2010)   LDL (bad choesterol):  126 (07/29/2009)   HDL (good cholesterol):  48.60 (09/22/2010)  CBC   WBC:  6.3 (07/29/2009)   RBC:  4.41 (07/29/2009)   Hgb:  14.2 (07/29/2009)   Hct:  41.0 (07/29/2009)   Platelets:  217.0 (07/29/2009)   MCV  93.0 (07/29/2009)   MCHC  34.6 (07/29/2009)   RDW  12.4 (07/29/2009)   PMN:  72.6 (07/29/2009)   Lymphs:  18.8 (07/29/2009)   Monos:  5.8 (07/29/2009)   Eosinophils:  2.2 (07/29/2009)   Basophil:  0.6 (07/29/2009)  Complete Metabolic Panel   Glucose:  114 (09/22/2010)   Sodium:  134 (09/22/2010)   Potassium:  3.9 (09/22/2010)   Chloride:  93 (09/22/2010)   CO2:  29 (09/22/2010)   BUN:  35 (09/22/2010)   Creatinine:  1.0 (09/22/2010)   Albumin:  3.9 (09/15/2007)   Total Protein:  7.2 (09/15/2007)   Calcium:  9.6 (09/22/2010)   Total Bili:  0.7 (09/15/2007)   Alk Phos:  123 (09/15/2007)   SGPT (ALT):  18 (09/15/2007)   SGOT (AST):  22 (09/15/2007)   Current Medications (verified): 1)  Furosemide 80 Mg Tabs (Furosemide) .... Take 1 Tablet By Mouth Two Times A Day (  Or As Directed) 2)  Gemfibrozil 600 Mg Tabs (Gemfibrozil) .... 2 By Mouth Once Daily 3)  Klor-Con M20 20 Meq Tbcr (Potassium Chloride Crys Cr) .... Take 1 Tablet By Mouth Once A Day 4)  Warfarin Sodium 5 Mg Tabs (Warfarin Sodium) .... Take 1 By Mouth Once Daily 5)  Combivent 103-18 Mcg/act Aero (Ipratropium-Albuterol) .... 2 Puffs Every 4 Hours As Needed For Breathing 6)  Simvastatin 10 Mg Tabs (Simvastatin) .Marland Kitchen.. 1 By Mouth Once Daily 7)  Calcium Carbonate 600 Mg Tabs (Calcium Carbonate) .Marland Kitchen.. 1 By Mouth Two Times A Day 8)  Vitamin D 2000 Unit Tabs (Cholecalciferol) .Marland Kitchen.. 1 By Mouth Once Daily 9)  Fosamax 70 Mg Tabs (Alendronate Sodium) .Marland Kitchen.. 1 By Mouth Every Sunday Am With Small Sip of Water, 1 Hour Before Any Other Medications or Food  Allergies (verified): No Known Drug Allergies  Past History:  Past Medical  History: Rheumatic fever-mild mitral regurg Asthma Congestive heart failure-last echo Jan 17,'06 EF 35-45% Hyperlipidemia Hypertension  Pulmonary embolism, hx of-2001 Cerebrovascular accident, hx of 1995 with L hemiparesis Atrial fib, chronic anticoag at [facility] osteoporosis - dexa spine -3.7 in 09/2010   Physician roster:    Amie Critchley  cards - [facility]   Review of Systems  The patient denies anorexia, weight loss, chest pain, and headaches.    Physical Exam  General:  overweight-appearing.  alert, well-developed, well-nourished, and cooperative to examination.    Lungs:  exp wheeze L>R, no cackels or rhonchi - fair air mvmt bilaterally Heart:  normal rate, irregular rhythm, no murmur, and no rub. BLE without edema. (laterally fatty ankles chronic) Psych:  Oriented X3, memory intact for recent and remote, normally interactive, good eye contact, not anxious appearing, not depressed appearing, and not agitated.      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 Iv infusion once every 12 months  Orders: [facility]-BMP (Basic Metabolic Panel-BMET) (80048-METABOL)  Problem # 2:  HYPERLIPIDEMIA (ICD-272.4)  resumed statin low dose 09/2010 - recehck now - tol well thus far  Her updated medication list for this problem includes:    Gemfibrozil 600 Mg Tabs (Gemfibrozil) .Marland Kitchen... 2 by mouth once daily    Simvastatin 10 Mg Tabs (Simvastatin) .Marland Kitchen... 1 by mouth once daily  Orders: [facility]-Lipid Panel (80061-LIPID)  Labs Reviewed: SGOT: 22 (09/15/2007)   SGPT: 18 (09/15/2007)   HDL:48.60 (09/22/2010), 40.00 (07/29/2009)  LDL:126 (07/29/2009), 129 (09/15/2007)  Chol:213 (09/22/2010), 182 (07/29/2009)  Trig:114.0 (09/22/2010), 81.0 (07/29/2009)  Problem #  3:  CHRONIC OBSTRUCTIVE PULMONARY DISEASE, ACUTE EXACERBATION (ICD-491.21)  steroid shot now and emperic doxy + tussionex for cough - erx done cont combivent and reminded of need to quit smoking  Orders: Prescription Created Electronically [phone]) Depo- Medrol 80mg  (J1040) Depo- Medrol 40mg  (J1030) Admin of Therapeutic Inj  intramuscular or subcutaneous ([address])  Complete Medication List: 1)  Furosemide 80 Mg Tabs (Furosemide) .... Take 1 tablet by mouth two times a day (or as directed) 2)  Gemfibrozil 600 Mg Tabs (Gemfibrozil) .... 2 by mouth once daily 3)  Klor-con M20 20 Meq Tbcr (Potassium chloride crys cr) .... Take 1 tablet by mouth once a day 4)  Warfarin Sodium 5 Mg Tabs (Warfarin sodium) .... Take 1 by mouth once daily 5)  Combivent 103-18 Mcg/act Aero (  Ipratropium-albuterol) .... 2 puffs every 4 hours as needed for breathing 6)  Simvastatin 10 Mg Tabs (Simvastatin) .Marland Kitchen.. 1 by mouth once daily 7)  Calcium Carbonate 600 Mg Tabs (Calcium carbonate) .Marland Kitchen.. 1 by mouth two times a day 8)  Vitamin D 2000 Unit Tabs (Cholecalciferol) .Marland Kitchen.. 1 by mouth once daily 9)  Reclast 5 Mg/127ml Soln (Zoledronic acid) .... Iv infusion once every 12 months 10)  Doxycycline Hyclate 100 Mg Caps (Doxycycline hyclate) .Marland Kitchen.. 1 by mouth two times a day x 7 days 11)  Tussionex Pennkinetic Er 10-8 Mg/15ml Lqcr (Hydrocod polst-chlorphen polst) .... 5cc by mouth every 12h as needed for cough symptoms  Other Orders: [facility]-Hepatic/Liver Function Pnl (80076-HEPATIC)  Patient Instructions: 1)  it was good to see you today. 2)  test(s) ordered today - your results will be posted on the phone tree for review in 48-72 hours from the time of test completion; call [phone] and enter your 9 digit MRN (listed above on this page, just below your name); if any changes need to be made or there are abnormal results, you will be contacted directly. 3)  stop fosamax and we will start reclast - once yearly infusion for your bones  at the hospital -Our office will contact you regarding this appointment once made.  4)  shot today for your wheeze and breathing (depomedrol) and start doxycycline (antibiotics) x 1 week also tussionex for cough symptoms  5)  your prescriptions and refills have been submitted to your pharmacy. Please take as directed. Contact our office if you believe you're having problems with the medication(s).  6)  Please schedule a follow-up appointment in 3-66months for cholesterol, blood pressure recheck; call sooner if problems.  Prescriptions: SIMVASTATIN 10 MG TABS (SIMVASTATIN) 1 by mouth once daily  #90 x 3   Entered and Authorized by:   Newt Lukes MD   Signed by:   Newt Lukes MD on 12/22/2010   Method used:   [facility]        [facility]  [address] [phone][facility] ([facility]ail[facility]       [address]       [place], [place]  [address]       Ph: [phone] or [phone]       Fax: [phone]   RxID:   [id number] Sandria Senter ER 10-8 MG/5ML LQCR (HYDROCOD POLST-CHLORPHEN POLST) 5cc by mouth every 12h as needed for cough symptoms  #100cc x 0   Entered and Authorized by:   Newt Lukes MD   Signed by:   Newt Lukes MD on 12/22/2010   Method used:   Printed then faxed to ...       [facility]  [address] [phone][facility] (retail)       [address]       [place], [place]  [address]       Ph: [phone] or [phone]       Fax: [phone]   RxID:   [id number] DOXYCYCLINE HYCLATE 100 MG CAPS (DOXYCYCLINE HYCLATE) 1 by mouth two times a day x 7 days  #14 x 0   Entered and Authorized by:   Newt Lukes MD   Signed by:   Newt Lukes MD on 12/22/2010   Method used:   [facility]        [facility]  [address] [phone]* (retail)  [address]       [place], [place]  [address]       Ph: [phone] or [phone]       Fax: [phone]   RxID:   [id number]    Medication Administration  Injection # 1:    Medication: Depo- Medrol  80mg     Diagnosis: CHRONIC OBSTRUCTIVE PULMONARY DISEASE, ACUTE EXACERBATION (ICD-491.21)    Route: IM    Site: RUOQ gluteus    Exp Date: 05/2013    Lot #: [id number]    Mfr: [employer]    Comments: Gave total of 120mg     Patient tolerated injection without complications    Given by: Orlan Leavens RMA (December 22, 2010 4:08 PM)  Injection # 2:    Medication: Depo- Medrol 40mg     Diagnosis: CHRONIC OBSTRUCTIVE PULMONARY DISEASE, ACUTE EXACERBATION (ICD-491.21)  Orders Added: 1)  TL[facility]-BMP (Basic Metabolic Panel-BMET) [80048-METABOL] 2)  TL[facility]-Lipid Panel [80061-LIPID] 3)  [facility]-Hepatic/Liver Function Pnl [80076-HEPATIC] 4)  Est. Patient Level IV [27253] 5)  Prescription Created Electronically [G8553] 6)  Depo- Medrol 80mg  [J1040] 7)  Depo- Medrol 40mg  [J1030] 8)  Admin of Therapeutic Inj  intramuscular or subcutaneous [66440]

## 2011-01-05 NOTE — Medication Information (Signed)
Summary: est  Anticoagulant Therapy  Managed by: Weston Brass, PharmD Referring MD: Illene Regulus PCP: Newt Lukes MD Supervising MD: Excell Seltzer MD, Casimiro Needle Indication 1: Pulmonary embolus (ICD-415.19) Indication 2: Atrial Fibrillation (ICD-427.31) Lab Used: [facility] Site: [facility] INR POC 2.7 INR RANGE 2.0-3.0  Dietary changes: yes       Details: less than normal  Health status changes: no    Bleeding/hemorrhagic complications: no    Recent/future hospitalizations: no    Any changes in medication regimen? yes       Details: started doxycycline 7 days ago and today is her last day  Recent/future dental: no  Any missed doses?: no       Is patient compliant with meds? yes       Allergies: No Known Drug Allergies  Anticoagulation Management History:      The patient is taking warfarin and comes in today for a routine follow up visit.  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  Negative risk factors for bleeding include an age less than 65 years old.  The bleeding index is 'intermediate risk'.  Positive CHADS2 values include History of CHF, History of HTN, and Prior Stroke/CVA/TIA.  Negative CHADS2 values include Age > 7 years old.  The start date was 01/08/2005.  Anticoagulation responsible provider: Excell Seltzer MD, Casimiro Needle.  INR POC: 2.7.  Cuvette Lot#: [id number].  Exp: 11/2011.    Anticoagulation Management Assessment/Plan:      The patient's current anticoagulation dose is Warfarin sodium 5 mg tabs: take 1 by mouth once daily.  The target INR is 2.0-3.0.  The next INR is due 01/12/2011.  Anticoagulation instructions were given to patient.  Results were reviewed/authorized by Weston Brass, PharmD.  She was notified by Margot Chimes PharmD Candidate.         Prior Anticoagulation Instructions: INR:  1.8  Take 1 tablet tonight then start your new dose of 1 tablet everyday except 1/2 tablet on Sundays, Wednesdays, and Fridays.  Recheck in 2  weeks.  Keep greens consistent.  Current Anticoagulation Instructions: INR 2.7  Continue to take 1 tablet everyday except for Sundays, Wednesdays, and Fridays when you take 1/2 tablet.  Recheck INR in 2 weeks.     Medication Administration  Injection # 1:    Medication: Vit B12 1000 mcg    Route: IM    Site: R deltoid    Exp Date: 07/10/2011    Lot #: [id number]    Mfr: [employer]ent    Patient tolerated injection without complications    Given by: Bethena Midget, RN, BSN (December 29, 2010 1:43 PM)

## 2011-01-06 ENCOUNTER — Encounter (HOSPITAL_COMMUNITY): Payer: Medicare Other | Attending: Internal Medicine

## 2011-01-06 ENCOUNTER — Encounter: Payer: Self-pay | Admitting: Internal Medicine

## 2011-01-06 DIAGNOSIS — M810 Age-related osteoporosis without current pathological fracture: Secondary | ICD-10-CM | POA: Insufficient documentation

## 2011-01-12 ENCOUNTER — Encounter: Payer: Self-pay | Admitting: Cardiology

## 2011-01-12 ENCOUNTER — Encounter (INDEPENDENT_AMBULATORY_CARE_PROVIDER_SITE_OTHER): Payer: Medicare Other

## 2011-01-12 DIAGNOSIS — I4891 Unspecified atrial fibrillation: Secondary | ICD-10-CM

## 2011-01-12 DIAGNOSIS — Z7901 Long term (current) use of anticoagulants: Secondary | ICD-10-CM

## 2011-01-12 DIAGNOSIS — I2699 Other pulmonary embolism without acute cor pulmonale: Secondary | ICD-10-CM

## 2011-01-12 LAB — CONVERTED CEMR LAB: POC INR: 2.3

## 2011-01-14 NOTE — Op Note (Signed)
Summary: Reclast/[place]  Reclast/[place]   Imported By: Sherian Rein 01/08/2011 12:26:13  _____________________________________________________________________  External Attachment:    Type:   Image     Comment:   External Document

## 2011-01-19 NOTE — Medication Information (Signed)
Summary: rov/tm  Anticoagulant Therapy  Managed by: Cloyde Reams, RN, BSN Referring MD: Illene Regulus PCP: Newt Lukes MD Supervising MD: Antoine Poche MD, Fayrene Fearing Indication 1: Pulmonary embolus (ICD-415.19) Indication 2: Atrial Fibrillation (ICD-427.31) Lab Used: [facility] Site: [facility] INR POC 2.3 INR RANGE 2.0-3.0  Dietary changes: no    Health status changes: no    Bleeding/hemorrhagic complications: no    Recent/future hospitalizations: no    Any changes in medication regimen? no    Recent/future dental: no  Any missed doses?: yes     Details: Missed last night's dosage   Is patient compliant with meds? yes       Allergies: No Known Drug Allergies  Anticoagulation Management History:      The patient is taking warfarin and comes in today for a routine follow up visit.  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  Negative risk factors for bleeding include an age less than 27 years old.  The bleeding index is 'intermediate risk'.  Positive CHADS2 values include History of CHF, History of HTN, and Prior Stroke/CVA/TIA.  Negative CHADS2 values include Age > 21 years old.  The start date was 01/08/2005.  Anticoagulation responsible provider: Antoine Poche MD, Fayrene Fearing.  INR POC: 2.3.  Cuvette Lot#: [id number].  Exp: 11/2011.    Anticoagulation Management Assessment/Plan:      The patient's current anticoagulation dose is Warfarin sodium 5 mg tabs: take 1 by mouth once daily.  The target INR is 2.0-3.0.  The next INR is due 02/02/2011.  Anticoagulation instructions were given to patient.  Results were reviewed/authorized by Cloyde Reams, RN, BSN.  She was notified by Cloyde Reams RN.         Prior Anticoagulation Instructions: INR 2.7  Continue to take 1 tablet everyday except for Sundays, Wednesdays, and Fridays when you take 1/2 tablet.  Recheck INR in 2 weeks.    Current Anticoagulation Instructions: INR 2.3  Take 1.5 tablets today, then resume  same dosage 1 tablet daily except 1/2 tablet on Sundays, Wednesdays, and Fridays.  Recheck in 3 weeks.

## 2011-01-26 ENCOUNTER — Ambulatory Visit (INDEPENDENT_AMBULATORY_CARE_PROVIDER_SITE_OTHER): Payer: Medicare Other | Admitting: *Deleted

## 2011-01-26 DIAGNOSIS — I4891 Unspecified atrial fibrillation: Secondary | ICD-10-CM

## 2011-01-26 DIAGNOSIS — I69959 Hemiplegia and hemiparesis following unspecified cerebrovascular disease affecting unspecified side: Secondary | ICD-10-CM

## 2011-01-26 DIAGNOSIS — Z8679 Personal history of other diseases of the circulatory system: Secondary | ICD-10-CM

## 2011-01-26 DIAGNOSIS — Z86718 Personal history of other venous thrombosis and embolism: Secondary | ICD-10-CM

## 2011-01-26 DIAGNOSIS — D518 Other vitamin B12 deficiency anemias: Secondary | ICD-10-CM

## 2011-01-26 LAB — POCT INR: INR: 2.9

## 2011-01-26 MED ORDER — CYANOCOBALAMIN 1000 MCG/ML IJ SOLN: 1000.0000 ug | Freq: Once | INTRAMUSCULAR | Status: DC

## 2011-01-26 NOTE — Patient Instructions (Signed)
INR 2.9 Continue 5mg s everyday except 2.5mg s on Sundays, Wednesdays and Fridays. Recheck in 4 weeks.

## 2011-02-02 ENCOUNTER — Encounter: Payer: Medicare Other | Admitting: *Deleted

## 2011-02-23 ENCOUNTER — Ambulatory Visit (INDEPENDENT_AMBULATORY_CARE_PROVIDER_SITE_OTHER): Payer: Medicare Other | Admitting: *Deleted

## 2011-02-23 DIAGNOSIS — Z8679 Personal history of other diseases of the circulatory system: Secondary | ICD-10-CM

## 2011-02-23 DIAGNOSIS — D518 Other vitamin B12 deficiency anemias: Secondary | ICD-10-CM

## 2011-02-23 DIAGNOSIS — I69959 Hemiplegia and hemiparesis following unspecified cerebrovascular disease affecting unspecified side: Secondary | ICD-10-CM

## 2011-02-23 DIAGNOSIS — Z86718 Personal history of other venous thrombosis and embolism: Secondary | ICD-10-CM

## 2011-02-23 DIAGNOSIS — I4891 Unspecified atrial fibrillation: Secondary | ICD-10-CM

## 2011-02-23 MED ORDER — CYANOCOBALAMIN 1000 MCG/ML IJ SOLN: 1000.0000 ug | Freq: Once | INTRAMUSCULAR | Status: DC

## 2011-03-23 ENCOUNTER — Ambulatory Visit (INDEPENDENT_AMBULATORY_CARE_PROVIDER_SITE_OTHER): Payer: Medicare Other | Admitting: *Deleted

## 2011-03-23 ENCOUNTER — Other Ambulatory Visit: Payer: Self-pay

## 2011-03-23 DIAGNOSIS — I4891 Unspecified atrial fibrillation: Secondary | ICD-10-CM

## 2011-03-23 DIAGNOSIS — I69959 Hemiplegia and hemiparesis following unspecified cerebrovascular disease affecting unspecified side: Secondary | ICD-10-CM

## 2011-03-23 DIAGNOSIS — Z86718 Personal history of other venous thrombosis and embolism: Secondary | ICD-10-CM

## 2011-03-23 DIAGNOSIS — Z8679 Personal history of other diseases of the circulatory system: Secondary | ICD-10-CM

## 2011-03-23 DIAGNOSIS — D518 Other vitamin B12 deficiency anemias: Secondary | ICD-10-CM

## 2011-03-23 MED ORDER — POTASSIUM CHLORIDE CRYS ER 20 MEQ PO TBCR: 20.0000 meq | EXTENDED_RELEASE_TABLET | Freq: Every day | ORAL | Status: DC

## 2011-03-23 MED ORDER — CYANOCOBALAMIN 1000 MCG/ML IJ SOLN: 1000.0000 ug | INTRAMUSCULAR | Status: DC

## 2011-03-23 NOTE — Patient Instructions (Signed)
Reviewed Vitamin B12 therapy with Dr Felicity Coyer. B12 therapy may  no longer be needed. Will discontinue Vitamin B12 shots until further injections ordered.

## 2011-03-23 NOTE — Telephone Encounter (Signed)
Pt called requesting to have monthly B-12 injection done at our office. Pt states she is S/P Gastric Bypass 7 years ago has been getting injections at [facility]. Pt is also requesting refill of Klor Con, last filled 08/26/2009 #90 x1, Okay to fill?

## 2011-03-23 NOTE — Telephone Encounter (Signed)
Yes - ok to add B12 monthly injections to med list and arrange nurse visit for same - thanks

## 2011-03-23 NOTE — Assessment & Plan Note (Signed)
[facility]                               LIPID CLINIC NOTE   Yvonne Collier, Yvonne Collier                MRN:          [id number]  DATE:05/23/2008                            DOB:          [birth year]    HISTORY:  Yvonne Collier is seen in the Lipid Clinic and in the  Pharmacotherapy Clinic for further evaluation, medication titration  associated with her desire to discontinue smoking.  She relates that she  has quit smoking in the past.  She has smoked for many years.  She  started when was 65 years old, but she did stop 5 years ago until  January when she started hanging around smokers again.  She states that  she does not smoke when she is busy and does not always have to have a  cigarette first thing in the morning when she awakens.  She has had  success including in the past by use of Wellbutrin SR, Valium twice  daily, and nicotine patches.  Last time, she reports she was able to  take the Wellbutrin for approximately 2 weeks, discontinued her  cigarettes, switched over to patches, and then discontinued everything  within about 6 weeks.  Her current max cigarette utilization is 10 mg a  day.  She has identified that her quit date will be in the next several  days by next Wednesday.  She will substitute lollipops as her  replacement item for cigarettes.   PAST MEDICAL HISTORY:  Pertinent for smoking cessation, atrial  fibrillation, history of gastric bypass surgery, history of stroke,  hypertension.   CURRENT MEDICATIONS:  Coumadin as directed, potassium 20 mEq daily,  blood pressure medicine as lisinopril an unknown milligram strength  daily, K-Dur 20 mEq daily, furosemide 40 mg twice daily.   ALLERGIES:  The patient has no known drug allergies.  She has had no  history of seizures or trouble tolerating nicotine patches or Wellbutrin  therapy.   REVIEW OF SYSTEMS:  As stated in HPI, otherwise negative.   PHYSICAL EXAMINATION:  VITAL SIGNS:   Blood pressure today in the office  is 120/68, respirations are 16, heart rate is 68.   LABORATORY DATA:  No labs have been obtained.   ASSESSMENT:  The patient has successfully quit in the past with this  regimen.  She has had no untoward effect associated with this.  She has  realistic expectation for her need to identify her triggers and remove  those from her daily activities.  She will call with questions or  problems in next several weeks.  She will begin by taking Wellbutrin SR  150 mg doses 1 time daily for 3 days and then follow as well.  She will  increase to 1 tablet twice daily at least 10 hours apart.  She will call  with questions or problems.  She will take these for approximately 1  week and then discontinue cigarettes completely and switch that over to  patches and begin on the 14 mg patch strength.  She denies any kind of  suicidal ideation as a new black box warning has been issued  and will follow up with [place] in approximately 1 month.  She will call with  questions or problems in the meantime.  I appreciate the opportunity to  see this pleasant patient.      Shelby Dubin, PharmD, BCPS, CPP  Electronically Signed      Noralyn Pick. Eden Emms, MD, [facility]  Electronically Signed   MP/MedQ  DD: 05/23/2008  DT: 05/24/2008  Job #: [id number]

## 2011-03-23 NOTE — Telephone Encounter (Signed)
Harriett Sine can you please help in scheduling pt for Nurse visit. Potassium refilled as requested

## 2011-03-26 NOTE — Discharge Summary (Signed)
NAME:  Yvonne Collier, Yvonne Collier         ACCOUNT NO.:  [id number]   MEDICAL RECORD NO.:  [id number]          PATIENT TYPE:  INP   LOCATION:  5501                         FACILITY:  [facility]   PHYSICIAN:  Rosalyn Gess. Norins, M.D. [facility] OF BIRTH:  1946/07/03   DATE OF ADMISSION:  10/01/2005  DATE OF DISCHARGE:                                 DISCHARGE SUMMARY   ADMISSION DIAGNOSES:  1.  Hypothermia.  2.  Hypokalemia.  3.  Atrial fibrillation.   DISCHARGE DIAGNOSES:  1.  Hypothermia.  2.  Hypokalemia.  3.  Atrial fibrillation.   HISTORY OF PRESENT ILLNESS:  Yvonne Collier is a 65 year old lady with a  history of congestive heart failure, history of CVA, who presented to the  emergency department for weakness, reporting she was too weak to walk.  She  also complained of mid-scapular back pain.  The patient had mild  diaphoresis.  The patient reports she had a three-month history of back  pain, for which she has been taking Tylenol.  She reports weakness for  several days.  The patient had no fevers, no cough, no chest pain.  She  reports that she had loose stools, two to three times a day for several  days.  The patient's p.o. intake had been somewhat reduced, but she had been  taking lots of fluids, including free water for an insatiable thirst.  The  patient reports she chooses to keep the house cool.  On the presentation to  the ER, rectal temperature was 92.6, and was hypokalemic.  She was admitted  for 23-hour observation.   PAST MEDICAL HISTORY, FAMILY HISTORY, AND SOCIAL HISTORY:  Well documented  in the office notes.   PHYSICAL EXAMINATION AT ADMISSION:  VITAL SIGNS:  Temperature of 92.9,  rectally, and was 97.2 at the time I admitted her.  Blood pressure 133/65,  heart rate 102, respirations 22, O2 sat was 97% on room air.  GENERAL:  This  is a well-nourished, well-developed white female, in no acute distress.  HEENT:  PERRL.  She is edentulous with dentures.  She had no  palpable  lesions.  ____________ was clear.  Sclerae clear.  NECK:  Supple.  There was  no thyromegaly.  No lymphadenopathy was noted.  CHEST:  Good breath sounds,  without rales, wheezes, or rhonchi.  CARDIOVASCULAR:  Two plus radial pulse  and dorsalis pedis pulse.  She had an irregularly regular rhythm, without  murmurs, rubs, or gallops, with a controlled rate.  She had no JVD or  carotid bruits.  BREASTS:  Revealed skin to be normal.  The patient had  minor fibrocystic-type changes, but no fixed mass lesion or abnormality was  appreciated.  ABDOMEN:  Soft, no guarding or rebound.  No hepatosplenomegaly  was noted.  RECTAL AND GENITALIA:  Deferred.  EXTREMITIES:  Without  cyanosis, clubbing, edema, or deformity.  NEUROLOGIC:  The patient is awake,  alert, and oriented to person, place, time, and context, with normal speech  and memory.  Cranial nerves II through XII were normal.  Motor strength:  The patient was able to move all extremities to command, including  lifting  her legs off the bed and offering resistance.  Cerebellar function was  normal.  DERM:  The patient had an erythematous facial rash across her  cheeks, chin, and forehead.  She had superficial veins to both lower  extremities.   ADMITTING DATA:  A 12-lead EKG with atrial fibrillation and LVH.   Hemoglobin was 13 grams, white count was 9600, with 79% segs, 15% lymphs, 4%  mono's.  CK-MB at point of care was 1, troponin-I was 0.05, D-dimer was  0.23.  INR was 3.1.  Chemistry reveals a sodium of 135, potassium 2.5,  chloride 101, CO2 of 26, BUN of 20, creatinine 0.8, glucose 90.  LFT's were  normal.  A chest x-ray revealed mild pulmonary edema.  A CT of the chest  revealed no sign of aneurysm, no PE, no parenchymal lesions.  The patient  was found to have a breast nodule at 1.2 cm on the right.  The patient was  noted to have some nodules along her colon.   HOSPITAL COURSE:  1.  Hypothermia.  The patient with no  prodromal illness.  In the ER, she      warmed up nicely.  She has maintained a normal body temperature since      hospital admission.  The patient's ESR was normal at 5, free T4 was      normal at 0.86, TSH was normal at 1.672.  With the patient maintaining      normal body temperature, with normal thyroid function and sedimentation      rate, I believe she has got no identified underlying causal disease for      her hypothermia, and is stable for discharge.  2.  Hypokalemia.  The patient had been taking __________ high-dose Lasix.      She was given oral replacement therapy.  Followup basic metabolic panel      at 1633 hours on the 25th, showed a potassium of 3.5, sodium of 134, BUN      of 13, creatinine of 0.8.  The plan is for the patient is to be      discharged home.  She will continue to take oral potassium replacement.  3.  Atrial fibrillation.  This is stable.  The patient had a therapeutic INR      and she will continue on her home regimen.  4.  Breast nodule.  The patient had a breast nodule on CT scan.  On my      examination of her breasts, I did not detect or palpate a nodule.  The      plan will be the patient will be seen in the office, at which time, she      will be scheduled for diagnostic mammogram.  5.  Colon nodules.  This was a finding on the CT scan.  The patient has had      no GI symptoms to suggest colon pathology.  She has not had a      colonoscopy.  When the patient is seen in the office for follow up, she      will be scheduled for a colonoscopy.   DISCHARGE EXAMINATION:  VITAL SIGNS:  Temperature 97.8, blood pressure  100/63, pulse of 74, respirations 20.  GENERAL APPEARANCE:  A well-nourished  woman in no acute distress.  CHEST:  Clear, with no rales, wheezes, or  rhonchi.  CARDIOVASCULAR:  Two plus radial pulses.  She had a quiet  precordium with an irregularly  irregular heart beat.  ABDOMEN:  Soft, no  guarding or rebound.  DISPOSITION:  The patient  is discharged home.  She is to continue all of the  home medications, per the home medication reconciliation order, and will add  to that, that she take potassium 20 mEq daily.   FOLLOW UP:  The patient is to be seen in the office on Tuesday the 28th, and  she will be notified of an appointment time.  At that time, she will need to  be scheduled for diagnostic mammogram and colonoscopy, as noted.  She will  also have follow up of B-met.   CONDITION ON DISCHARGE:  Stable and improved.           ______________________________  Rosalyn Gess Norins, M.D. [facility]     MEN/MEDQ  D:  10/03/2005  T:  10/03/2005  Job:  [id number]

## 2011-03-26 NOTE — Discharge Summary (Signed)
NAME:  Yvonne Collier, Yvonne Collier         ACCOUNT NO.:  [id number]   MEDICAL RECORD NO.:  [id number]          PATIENT TYPE:  INP   LOCATION:  5501                         FACILITY:  [facility]   PHYSICIAN:  Rosalyn Gess. Norins, M.D. [facility] OF BIRTH:  [birth year]   DATE OF ADMISSION:  10/01/2005  DATE OF DISCHARGE:                                 DISCHARGE SUMMARY   ADMITTING DIAGNOSES:  1.  Hypothermia.  2.  Hypokalemia.  3.  Atrial fibrillation, stable.   DISCHARGE DIAGNOSES:  1.  Hypothermia.  2.  Hypokalemia.  3.  Atrial fibrillation, stable.   HISTORY OF PRESENT ILLNESS:  The patient presented to the emergency  department for weakness, too weak to walk and mid-scapula pain.  The patient  had a three month history of back pain.  This therapy reveals no cough, no  chills, no chest pain otherwise.  She reports several days of loose stools.  The patient has been eating small amounts, taking lots of fluids with an  insatiable thirst.  The patient reports that the house was kept cool.  In  the emergency department, her initial temperature was 92.6, rectally. and  she was hypokalemic.   HOSPITAL COURSE:  Problem 1. Hypothermia.  The patient was admitted to a  regular bed after being warmed in the ER.  Throughout the rest of the 24-  hour stay, the patient's temperatures were normal.  By the time of discharge  dictation, the temperature was 97.8.   Dictation ended at this point.           ______________________________  Rosalyn Gess. Norins, M.D. [facility]     MEN/MEDQ  D:  10/03/2005  T:  10/03/2005  Job:  [id number]

## 2011-03-30 ENCOUNTER — Encounter: Payer: Self-pay | Admitting: Internal Medicine

## 2011-04-06 ENCOUNTER — Ambulatory Visit (INDEPENDENT_AMBULATORY_CARE_PROVIDER_SITE_OTHER): Payer: Medicare Other | Admitting: *Deleted

## 2011-04-06 DIAGNOSIS — Z86718 Personal history of other venous thrombosis and embolism: Secondary | ICD-10-CM

## 2011-04-06 DIAGNOSIS — I4891 Unspecified atrial fibrillation: Secondary | ICD-10-CM

## 2011-04-06 DIAGNOSIS — Z8679 Personal history of other diseases of the circulatory system: Secondary | ICD-10-CM

## 2011-04-06 DIAGNOSIS — I69959 Hemiplegia and hemiparesis following unspecified cerebrovascular disease affecting unspecified side: Secondary | ICD-10-CM

## 2011-04-09 ENCOUNTER — Ambulatory Visit (INDEPENDENT_AMBULATORY_CARE_PROVIDER_SITE_OTHER): Payer: Medicare Other

## 2011-04-09 DIAGNOSIS — D518 Other vitamin B12 deficiency anemias: Secondary | ICD-10-CM

## 2011-04-09 MED ORDER — CYANOCOBALAMIN 1000 MCG/ML IJ SOLN
1000.0000 ug | Freq: Once | INTRAMUSCULAR | Status: AC
  Administered 2011-04-09: 1000 ug via INTRAMUSCULAR

## 2011-04-20 ENCOUNTER — Ambulatory Visit: Payer: Medicare Other

## 2011-04-27 ENCOUNTER — Other Ambulatory Visit: Payer: Self-pay | Admitting: Internal Medicine

## 2011-04-28 ENCOUNTER — Other Ambulatory Visit: Payer: Self-pay | Admitting: *Deleted

## 2011-04-28 ENCOUNTER — Other Ambulatory Visit: Payer: Self-pay | Admitting: Internal Medicine

## 2011-04-28 MED ORDER — WARFARIN SODIUM 5 MG PO TABS: ORAL_TABLET | ORAL | Status: DC

## 2011-04-28 NOTE — Telephone Encounter (Signed)
Are you rx coumadin for this. Pt is requesting refill. Pls advise

## 2011-04-28 NOTE — Telephone Encounter (Signed)
No - please forward to [facility] who is managing her anticoag - thanks

## 2011-04-28 NOTE — Telephone Encounter (Signed)
Sen info back to [facility], also faxed paper request to [facility] for refills.Marland KitchenMarland Kitchen6/20/12@1 :26pm/LMB

## 2011-05-04 ENCOUNTER — Ambulatory Visit (INDEPENDENT_AMBULATORY_CARE_PROVIDER_SITE_OTHER): Payer: Medicare Other | Admitting: *Deleted

## 2011-05-04 DIAGNOSIS — Z8679 Personal history of other diseases of the circulatory system: Secondary | ICD-10-CM

## 2011-05-04 DIAGNOSIS — I4891 Unspecified atrial fibrillation: Secondary | ICD-10-CM

## 2011-05-04 DIAGNOSIS — I69959 Hemiplegia and hemiparesis following unspecified cerebrovascular disease affecting unspecified side: Secondary | ICD-10-CM

## 2011-05-04 DIAGNOSIS — Z86718 Personal history of other venous thrombosis and embolism: Secondary | ICD-10-CM

## 2011-05-04 LAB — POCT INR: INR: 2.3

## 2011-05-18 ENCOUNTER — Ambulatory Visit (INDEPENDENT_AMBULATORY_CARE_PROVIDER_SITE_OTHER): Payer: Medicare Other

## 2011-05-18 DIAGNOSIS — D518 Other vitamin B12 deficiency anemias: Secondary | ICD-10-CM | POA: Insufficient documentation

## 2011-05-18 MED ORDER — CYANOCOBALAMIN 1000 MCG/ML IJ SOLN
1000.0000 ug | Freq: Once | INTRAMUSCULAR | Status: AC
  Administered 2011-05-18: 1000 ug via INTRAMUSCULAR

## 2011-05-29 ENCOUNTER — Other Ambulatory Visit: Payer: Self-pay | Admitting: Internal Medicine

## 2011-06-01 ENCOUNTER — Encounter: Payer: Medicare Other | Admitting: *Deleted

## 2011-06-09 ENCOUNTER — Ambulatory Visit (INDEPENDENT_AMBULATORY_CARE_PROVIDER_SITE_OTHER): Payer: Medicare Other | Admitting: *Deleted

## 2011-06-09 ENCOUNTER — Encounter: Payer: Self-pay | Admitting: Internal Medicine

## 2011-06-09 ENCOUNTER — Ambulatory Visit (INDEPENDENT_AMBULATORY_CARE_PROVIDER_SITE_OTHER): Payer: Medicare Other | Admitting: Internal Medicine

## 2011-06-09 VITALS — BP 120/62 | HR 60 | Temp 97.4°F | Ht 64.0 in | Wt 156.1 lb

## 2011-06-09 DIAGNOSIS — I10 Essential (primary) hypertension: Secondary | ICD-10-CM

## 2011-06-09 DIAGNOSIS — I69959 Hemiplegia and hemiparesis following unspecified cerebrovascular disease affecting unspecified side: Secondary | ICD-10-CM

## 2011-06-09 DIAGNOSIS — Z86718 Personal history of other venous thrombosis and embolism: Secondary | ICD-10-CM

## 2011-06-09 DIAGNOSIS — I4891 Unspecified atrial fibrillation: Secondary | ICD-10-CM

## 2011-06-09 DIAGNOSIS — E538 Deficiency of other specified B group vitamins: Secondary | ICD-10-CM

## 2011-06-09 DIAGNOSIS — M810 Age-related osteoporosis without current pathological fracture: Secondary | ICD-10-CM

## 2011-06-09 DIAGNOSIS — Z8679 Personal history of other diseases of the circulatory system: Secondary | ICD-10-CM

## 2011-06-09 DIAGNOSIS — M62838 Other muscle spasm: Secondary | ICD-10-CM

## 2011-06-09 DIAGNOSIS — E785 Hyperlipidemia, unspecified: Secondary | ICD-10-CM

## 2011-06-09 LAB — POCT INR: INR: 2.5

## 2011-06-09 MED ORDER — VITAMIN D 1000 UNITS PO TABS: 1000.0000 [IU] | ORAL_TABLET | Freq: Every day | ORAL | Status: DC

## 2011-06-09 MED ORDER — METHOCARBAMOL 500 MG PO TABS: 500.0000 mg | ORAL_TABLET | Freq: Three times a day (TID) | ORAL | Status: DC | PRN

## 2011-06-09 MED ORDER — CYANOCOBALAMIN 1000 MCG/ML IJ SOLN
1000.0000 ug | Freq: Once | INTRAMUSCULAR | Status: AC
  Administered 2011-06-09: 1000 ug via INTRAMUSCULAR

## 2011-06-09 NOTE — Assessment & Plan Note (Signed)
Changed to IV reclast 12/2010 prev on fosamax

## 2011-06-09 NOTE — Assessment & Plan Note (Addendum)
BP Readings from Last 3 Encounters:  06/09/11 120/62  12/22/10 118/62  09/22/10 82/52   Off meds since fall 2011 due to hypotension and underperfusion symptoms ((dizziness) BP controlled without tx - continue same and observation

## 2011-06-09 NOTE — Patient Instructions (Signed)
It was good to see you today. Use robaxin for muscle relaxer to ease neck and back pain symptoms - Your prescription(s) have been submitted to your pharmacy. Please take as directed and contact our office if you believe you are having problem(s) with the medication(s). Other Medications reviewed, no changes at this time. Refill on medication(s) as discussed today. Continue to work on giving up those last cigarettes - you can do it! Please schedule followup in 6 months, call sooner if problems. Will recheck your cholesterol at that visit so come in fasting

## 2011-06-09 NOTE — Assessment & Plan Note (Signed)
Hx CVA 1995, mild residual L HP on coumadin for same - [facility] follows same The current medical regimen is effective;  continue present plan and medications.

## 2011-06-09 NOTE — Progress Notes (Signed)
Subjective:    Patient ID: Yvonne Collier, female    DOB: 02/02/1946, 65 y.o.   MRN: [id number]  HPI  here for followup - reviewed chronic medical issues:  COPD - prev quit smoking but resumed spring 2011  still planning to quit -stuck at 2cig/d  No recent flares   dyslipidemia - resumed low dose stain 09/2010 - ongoing gemfib - reports compliance with ongoing medical treatment and no changes in medication dose or frequency. denies adverse side effects related to current therapy.   osteoporosis - dexa 09/2010 with -3.7 at spine - started fosamax for same and Ca+ D - changed to Reclast 12/2010 due to poor oral med compliance. denies adverse side effects related to current therapy    A fib -chronic anticoag - reports compliance with ongoing medical treatment and no changes in medication dose or frequency. denies adverse side effects related to current therapy. follows with [facility] for same - no shortness of breath or edema   HTN hx - off meds >1 year due to underperfusion and dizziness - no edema, chest pain or headache symptoms off tx  Also complains of neck pain x 1 week, hx same - precipitated by overuse   Past Medical History  Diagnosis Date  . Atrial fibrillation     chronic anticoag  . PULMONARY EMBOLISM, HX OF 2001  . ASTHMA   . CONGESTIVE HEART FAILURE   . CHRONIC OBSTRUCTIVE PULMONARY DISEASE     ongoing tobacco  . DEPRESSION   . CARDIAC MURMUR   . HYPERLIPIDEMIA   . HYPERTENSION     off meds since 2011 due to sx hypotension  . POSTMENOPAUSAL OSTEOPOROSIS     intol fosamax (noncomp), reclast started 12/2010  . CVA WITH LEFT HEMIPARESIS 1995  . TOBACCO ABUSE   . RHEUMATIC FEVER, HX OF      Review of Systems  Constitutional: Positive for fatigue. Negative for fever.  Cardiovascular: Negative for chest pain and palpitations.  Musculoskeletal:       Complains of neck spasm x 1 week precipitated by overexertion   Neurological: Negative for dizziness, syncope,  weakness and headaches.       Objective:   Physical Exam BP 120/62  Pulse 60  Temp(Src) 97.4 F (36.3 C) (Oral)  Ht 5\' 4"  (1.626 m)  Wt 156 lb 1.9 oz (70.816 kg)  BMI 26.80 kg/m2  SpO2 97%  Wt Readings from Last 3 Encounters:  06/09/11 156 lb 1.9 oz (70.816 kg)  12/22/10 167 lb 9.6 oz (76.023 kg)  09/22/10 160 lb (72.576 kg)   Constitutional: She is overweight; oriented to person, place, and time. She appears well-developed and well-nourished. No distress.  Neck: Normal range of motion. Neck supple. No JVD present. No thyromegaly present.  Cardiovascular: Normal rate, irregular rhythm and normal heart sounds.  No murmur heard. No BLE edema - but B fatty ankles Pulmonary/Chest: Effort normal and breath sounds normal. No respiratory distress. She has no wheezes.  Mskel: muscle spasm B posterior neck and scapular region - myofascial tenderness but FROM neck and shoulders Neurological: She is alert and oriented to person, place, and time. No cranial nerve deficit. Coordination normal. Psychiatric: She has a normal mood and affect. Her behavior is normal. Judgment and thought content normal.   Lab Results  Component Value Date   WBC 6.3 07/29/2009   HGB 14.2 07/29/2009   HCT 41.0 07/29/2009   PLT 217.0 07/29/2009   CHOL 147 12/22/2010   TRIG 115.0 12/22/2010  HDL 42.20 12/22/2010   LDLDIRECT 150.2 09/22/2010   ALT 20 12/22/2010   AST 25 12/22/2010   NA 141 12/22/2010   K 4.4 12/22/2010   CL 104 12/22/2010   CREATININE 0.6 12/22/2010   BUN 16 12/22/2010   CO2 33* 12/22/2010   TSH 2.93 07/29/2009   INR 2.3 05/04/2011       Assessment & Plan:  See problem list. Medications and labs reviewed today.  Muscle spasm - B trap (shoulder blade) after overexertion - moving/cleaning out family member's apt - tx muscle relaxer prn - erx done

## 2011-06-09 NOTE — Assessment & Plan Note (Signed)
Low dose simva resumed 09/2010 - inconsistent use - also rx lopid Check lipids now - encouraged med compliance

## 2011-06-22 ENCOUNTER — Ambulatory Visit: Payer: Medicare Other | Admitting: Internal Medicine

## 2011-07-07 ENCOUNTER — Encounter: Payer: Medicare Other | Admitting: *Deleted

## 2011-07-16 ENCOUNTER — Ambulatory Visit (INDEPENDENT_AMBULATORY_CARE_PROVIDER_SITE_OTHER): Payer: Medicare Other | Admitting: *Deleted

## 2011-07-16 DIAGNOSIS — E538 Deficiency of other specified B group vitamins: Secondary | ICD-10-CM

## 2011-07-16 DIAGNOSIS — Z23 Encounter for immunization: Secondary | ICD-10-CM

## 2011-07-16 DIAGNOSIS — Z8679 Personal history of other diseases of the circulatory system: Secondary | ICD-10-CM

## 2011-07-16 DIAGNOSIS — I4891 Unspecified atrial fibrillation: Secondary | ICD-10-CM

## 2011-07-16 DIAGNOSIS — I69959 Hemiplegia and hemiparesis following unspecified cerebrovascular disease affecting unspecified side: Secondary | ICD-10-CM

## 2011-07-16 DIAGNOSIS — Z86718 Personal history of other venous thrombosis and embolism: Secondary | ICD-10-CM

## 2011-07-16 LAB — POCT INR: INR: 2.2

## 2011-07-16 MED ORDER — CYANOCOBALAMIN 1000 MCG/ML IJ SOLN
1000.0000 ug | Freq: Once | INTRAMUSCULAR | Status: AC
  Administered 2011-07-16: 1000 ug via INTRAMUSCULAR

## 2011-08-05 ENCOUNTER — Other Ambulatory Visit: Payer: Self-pay | Admitting: *Deleted

## 2011-08-05 DIAGNOSIS — M62838 Other muscle spasm: Secondary | ICD-10-CM

## 2011-08-05 MED ORDER — METHOCARBAMOL 500 MG PO TABS: 500.0000 mg | ORAL_TABLET | Freq: Three times a day (TID) | ORAL | Status: DC | PRN

## 2011-08-05 NOTE — Telephone Encounter (Signed)
Refill on muscle relaxer done (robaxin) as ordered for pain 06/2011 OV - thanks

## 2011-08-05 NOTE — Telephone Encounter (Signed)
Patient requesting "pain pills" to be called into the pharm.

## 2011-08-13 ENCOUNTER — Ambulatory Visit (INDEPENDENT_AMBULATORY_CARE_PROVIDER_SITE_OTHER): Payer: Medicare Other | Admitting: *Deleted

## 2011-08-13 ENCOUNTER — Ambulatory Visit: Payer: Medicare Other

## 2011-08-13 DIAGNOSIS — Z8679 Personal history of other diseases of the circulatory system: Secondary | ICD-10-CM

## 2011-08-13 DIAGNOSIS — I4891 Unspecified atrial fibrillation: Secondary | ICD-10-CM

## 2011-08-13 DIAGNOSIS — Z86718 Personal history of other venous thrombosis and embolism: Secondary | ICD-10-CM

## 2011-08-13 DIAGNOSIS — I69959 Hemiplegia and hemiparesis following unspecified cerebrovascular disease affecting unspecified side: Secondary | ICD-10-CM

## 2011-08-13 LAB — POCT INR: INR: 2.7

## 2011-08-19 ENCOUNTER — Ambulatory Visit (INDEPENDENT_AMBULATORY_CARE_PROVIDER_SITE_OTHER): Payer: Medicare Other | Admitting: *Deleted

## 2011-08-19 DIAGNOSIS — E538 Deficiency of other specified B group vitamins: Secondary | ICD-10-CM

## 2011-08-19 MED ORDER — CYANOCOBALAMIN 1000 MCG/ML IJ SOLN
1000.0000 ug | Freq: Once | INTRAMUSCULAR | Status: AC
  Administered 2011-08-19: 1000 ug via INTRAMUSCULAR

## 2011-08-23 ENCOUNTER — Other Ambulatory Visit: Payer: Self-pay

## 2011-08-23 MED ORDER — WARFARIN SODIUM 5 MG PO TABS: 5.0000 mg | ORAL_TABLET | ORAL | Status: DC

## 2011-08-23 MED ORDER — WARFARIN SODIUM 5 MG PO TABS: ORAL_TABLET | ORAL | Status: DC

## 2011-08-30 ENCOUNTER — Other Ambulatory Visit: Payer: Self-pay | Admitting: Internal Medicine

## 2011-09-10 ENCOUNTER — Ambulatory Visit (INDEPENDENT_AMBULATORY_CARE_PROVIDER_SITE_OTHER): Payer: Medicare Other | Admitting: *Deleted

## 2011-09-10 DIAGNOSIS — Z8679 Personal history of other diseases of the circulatory system: Secondary | ICD-10-CM

## 2011-09-10 DIAGNOSIS — I69959 Hemiplegia and hemiparesis following unspecified cerebrovascular disease affecting unspecified side: Secondary | ICD-10-CM

## 2011-09-10 DIAGNOSIS — I4891 Unspecified atrial fibrillation: Secondary | ICD-10-CM

## 2011-09-10 DIAGNOSIS — Z86718 Personal history of other venous thrombosis and embolism: Secondary | ICD-10-CM

## 2011-09-20 ENCOUNTER — Ambulatory Visit (INDEPENDENT_AMBULATORY_CARE_PROVIDER_SITE_OTHER): Payer: Medicare Other | Admitting: *Deleted

## 2011-09-20 DIAGNOSIS — D518 Other vitamin B12 deficiency anemias: Secondary | ICD-10-CM

## 2011-09-20 MED ORDER — CYANOCOBALAMIN 1000 MCG/ML IJ SOLN
1000.0000 ug | Freq: Once | INTRAMUSCULAR | Status: AC
  Administered 2011-09-20: 1000 ug via INTRAMUSCULAR

## 2011-09-25 ENCOUNTER — Other Ambulatory Visit: Payer: Self-pay | Admitting: Internal Medicine

## 2011-10-22 ENCOUNTER — Encounter: Payer: Medicare Other | Admitting: *Deleted

## 2011-10-25 ENCOUNTER — Other Ambulatory Visit: Payer: Self-pay | Admitting: *Deleted

## 2011-10-25 MED ORDER — WARFARIN SODIUM 5 MG PO TABS: 5.0000 mg | ORAL_TABLET | ORAL | Status: DC

## 2011-10-28 ENCOUNTER — Other Ambulatory Visit: Payer: Self-pay | Admitting: *Deleted

## 2011-10-28 MED ORDER — SIMVASTATIN 10 MG PO TABS: 10.0000 mg | ORAL_TABLET | Freq: Every day | ORAL | Status: DC

## 2011-10-29 ENCOUNTER — Ambulatory Visit (INDEPENDENT_AMBULATORY_CARE_PROVIDER_SITE_OTHER): Payer: Medicare Other | Admitting: *Deleted

## 2011-10-29 DIAGNOSIS — I4891 Unspecified atrial fibrillation: Secondary | ICD-10-CM

## 2011-10-29 DIAGNOSIS — Z86718 Personal history of other venous thrombosis and embolism: Secondary | ICD-10-CM

## 2011-10-29 DIAGNOSIS — Z8679 Personal history of other diseases of the circulatory system: Secondary | ICD-10-CM

## 2011-10-29 DIAGNOSIS — I69959 Hemiplegia and hemiparesis following unspecified cerebrovascular disease affecting unspecified side: Secondary | ICD-10-CM

## 2011-11-25 ENCOUNTER — Other Ambulatory Visit: Payer: Self-pay

## 2011-11-25 ENCOUNTER — Emergency Department (HOSPITAL_COMMUNITY): Payer: Medicare Other

## 2011-11-25 ENCOUNTER — Inpatient Hospital Stay (HOSPITAL_COMMUNITY)
Admission: EM | Admit: 2011-11-25 | Discharge: 2011-11-28 | DRG: 065 | Disposition: A | Discharge status: Home Health | Payer: Medicare Other | Source: Ambulatory Visit | Attending: Internal Medicine | Admitting: Internal Medicine

## 2011-11-25 ENCOUNTER — Encounter (HOSPITAL_COMMUNITY): Payer: Self-pay | Admitting: Radiology

## 2011-11-25 DIAGNOSIS — M129 Arthropathy, unspecified: Secondary | ICD-10-CM | POA: Diagnosis present

## 2011-11-25 DIAGNOSIS — Z86718 Personal history of other venous thrombosis and embolism: Secondary | ICD-10-CM

## 2011-11-25 DIAGNOSIS — R011 Cardiac murmur, unspecified: Secondary | ICD-10-CM

## 2011-11-25 DIAGNOSIS — I4891 Unspecified atrial fibrillation: Secondary | ICD-10-CM | POA: Diagnosis present

## 2011-11-25 DIAGNOSIS — B070 Plantar wart: Secondary | ICD-10-CM

## 2011-11-25 DIAGNOSIS — R471 Dysarthria and anarthria: Secondary | ICD-10-CM | POA: Diagnosis present

## 2011-11-25 DIAGNOSIS — Z79899 Other long term (current) drug therapy: Secondary | ICD-10-CM

## 2011-11-25 DIAGNOSIS — Z8679 Personal history of other diseases of the circulatory system: Secondary | ICD-10-CM

## 2011-11-25 DIAGNOSIS — I509 Heart failure, unspecified: Secondary | ICD-10-CM | POA: Diagnosis present

## 2011-11-25 DIAGNOSIS — J4489 Other specified chronic obstructive pulmonary disease: Secondary | ICD-10-CM | POA: Diagnosis present

## 2011-11-25 DIAGNOSIS — I639 Cerebral infarction, unspecified: Secondary | ICD-10-CM

## 2011-11-25 DIAGNOSIS — Z9884 Bariatric surgery status: Secondary | ICD-10-CM

## 2011-11-25 DIAGNOSIS — M549 Dorsalgia, unspecified: Secondary | ICD-10-CM | POA: Diagnosis present

## 2011-11-25 DIAGNOSIS — I69959 Hemiplegia and hemiparesis following unspecified cerebrovascular disease affecting unspecified side: Secondary | ICD-10-CM

## 2011-11-25 DIAGNOSIS — M546 Pain in thoracic spine: Secondary | ICD-10-CM

## 2011-11-25 DIAGNOSIS — F17200 Nicotine dependence, unspecified, uncomplicated: Secondary | ICD-10-CM | POA: Diagnosis present

## 2011-11-25 DIAGNOSIS — M810 Age-related osteoporosis without current pathological fracture: Secondary | ICD-10-CM | POA: Diagnosis present

## 2011-11-25 DIAGNOSIS — Z7901 Long term (current) use of anticoagulants: Secondary | ICD-10-CM

## 2011-11-25 DIAGNOSIS — R200 Anesthesia of skin: Secondary | ICD-10-CM

## 2011-11-25 DIAGNOSIS — G8929 Other chronic pain: Secondary | ICD-10-CM | POA: Diagnosis present

## 2011-11-25 DIAGNOSIS — F329 Major depressive disorder, single episode, unspecified: Secondary | ICD-10-CM

## 2011-11-25 DIAGNOSIS — G8190 Hemiplegia, unspecified affecting unspecified side: Secondary | ICD-10-CM | POA: Diagnosis present

## 2011-11-25 DIAGNOSIS — R531 Weakness: Secondary | ICD-10-CM

## 2011-11-25 DIAGNOSIS — M62838 Other muscle spasm: Secondary | ICD-10-CM

## 2011-11-25 DIAGNOSIS — F3289 Other specified depressive episodes: Secondary | ICD-10-CM | POA: Diagnosis present

## 2011-11-25 DIAGNOSIS — D518 Other vitamin B12 deficiency anemias: Secondary | ICD-10-CM

## 2011-11-25 DIAGNOSIS — J449 Chronic obstructive pulmonary disease, unspecified: Secondary | ICD-10-CM

## 2011-11-25 DIAGNOSIS — I6340 Cerebral infarction due to embolism of unspecified cerebral artery: Principal | ICD-10-CM | POA: Diagnosis present

## 2011-11-25 DIAGNOSIS — R51 Headache: Secondary | ICD-10-CM | POA: Diagnosis present

## 2011-11-25 DIAGNOSIS — E785 Hyperlipidemia, unspecified: Secondary | ICD-10-CM | POA: Diagnosis present

## 2011-11-25 DIAGNOSIS — Z7982 Long term (current) use of aspirin: Secondary | ICD-10-CM

## 2011-11-25 DIAGNOSIS — J45909 Unspecified asthma, uncomplicated: Secondary | ICD-10-CM

## 2011-11-25 DIAGNOSIS — R29810 Facial weakness: Secondary | ICD-10-CM | POA: Diagnosis present

## 2011-11-25 DIAGNOSIS — Z86711 Personal history of pulmonary embolism: Secondary | ICD-10-CM

## 2011-11-25 DIAGNOSIS — J441 Chronic obstructive pulmonary disease with (acute) exacerbation: Secondary | ICD-10-CM

## 2011-11-25 DIAGNOSIS — I10 Essential (primary) hypertension: Secondary | ICD-10-CM | POA: Diagnosis present

## 2011-11-25 HISTORY — DX: Dorsalgia, unspecified: M54.9

## 2011-11-25 HISTORY — DX: Unspecified osteoarthritis, unspecified site: M19.90

## 2011-11-25 HISTORY — DX: Cerebral infarction, unspecified: I63.9

## 2011-11-25 HISTORY — DX: Other chronic pain: G89.29

## 2011-11-25 LAB — CK TOTAL AND CKMB (NOT AT ARMC)
CK, MB: 1.8 ng/mL (ref 0.3–4.0)
Total CK: 62 U/L (ref 7–177)

## 2011-11-25 LAB — DIFFERENTIAL
Eosinophils Relative: 2 % (ref 0–5)
Lymphocytes Relative: 21 % (ref 12–46)
Lymphs Abs: 2.1 10*3/uL (ref 0.7–4.0)
Monocytes Absolute: 0.7 10*3/uL (ref 0.1–1.0)
Monocytes Relative: 7 % (ref 3–12)
Neutro Abs: 6.9 10*3/uL (ref 1.7–7.7)

## 2011-11-25 LAB — COMPREHENSIVE METABOLIC PANEL
AST: 24 U/L (ref 0–37)
BUN: 13 mg/dL (ref 6–23)
CO2: 28 mEq/L (ref 19–32)
Calcium: 9.2 mg/dL (ref 8.4–10.5)
Chloride: 97 mEq/L (ref 96–112)
Creatinine, Ser: 0.69 mg/dL (ref 0.50–1.10)
GFR calc Af Amer: >90 mL/min (ref >90)
GFR calc non Af Amer: >90 mL/min (ref >90)
Glucose, Bld: 115 mg/dL — ABNORMAL HIGH (ref 70–99)
Total Bilirubin: 0.6 mg/dL (ref 0.3–1.2)

## 2011-11-25 LAB — POCT I-STAT, CHEM 8
Calcium, Ion: 1.06 mmol/L — ABNORMAL LOW (ref 1.12–1.32)
Creatinine, Ser: 0.8 mg/dL (ref 0.50–1.10)
Glucose, Bld: 112 mg/dL — ABNORMAL HIGH (ref 70–99)
Hemoglobin: 16 g/dL — ABNORMAL HIGH (ref 12.0–15.0)
Potassium: 3.3 mEq/L — ABNORMAL LOW (ref 3.5–5.1)

## 2011-11-25 LAB — CBC
HCT: 44.3 % (ref 36.0–46.0)
Hemoglobin: 14.8 g/dL (ref 12.0–15.0)
MCV: 90.4 fL (ref 78.0–100.0)
WBC: 9.9 10*3/uL (ref 4.0–10.5)

## 2011-11-25 LAB — APTT: aPTT: 44 seconds — ABNORMAL HIGH (ref 24–37)

## 2011-11-25 MED ORDER — SODIUM CHLORIDE 0.9 % IV BOLUS (SEPSIS)
1000.0000 mL | Freq: Once | INTRAVENOUS | Status: AC
  Administered 2011-11-25: 1000 mL via INTRAVENOUS

## 2011-11-25 MED ORDER — DILTIAZEM HCL 25 MG/5ML IV SOLN
10.0000 mg | Freq: Once | INTRAVENOUS | Status: AC
  Administered 2011-11-25: 10 mg via INTRAVENOUS
  Filled 2011-11-25: qty 5

## 2011-11-25 NOTE — ED Notes (Signed)
Called Code Stroke @2145  per Dr. Roselyn Bering.  [facility] notified

## 2011-11-25 NOTE — Code Documentation (Signed)
66 yo female presented to ED via private vehicle through triage with sudden onset increased Lt UE & LE weakness & numbness.  Pt has hx of previous stroke with Lt side weakness & sensory deficit. NIH 4.  Code stroke called: 2145, Pt arrival 2143, EDP exam 2145, stroke team arrival 2200, last seen normal 1945, Pt arrival to CT 2157, Phlebotomist arrival 2146.  Pt in A. Fib HR 118-128, on coumadin though hasn't taken a dose tonight.  Dr. Marjory Lies & Dr. Lynelle Doctor updated. INR 2.06. Code stroke cancelled at 2242

## 2011-11-25 NOTE — ED Notes (Addendum)
Pt sister cell phone number Kendal Hymen for updates [phone] Pt daughter for updates in [place] [phone]

## 2011-11-25 NOTE — ED Notes (Signed)
Code Stroke Log: pt arrived to ED 21:43, Code Stroke called at 21:45, EDP exam 21:45, Last seen normal 19:45, Pt to CT 21:57, Stroke team arrival (did not receive first page) at 22:00. Phlebotomy 21:46,

## 2011-11-25 NOTE — ED Notes (Signed)
Straight back to room via w/c with RN for "possible stroke", EDP & CN made aware.

## 2011-11-25 NOTE — ED Notes (Signed)
pts family member called and asked to be updated on pts condition.  Her name is Tilden Dome [phone].

## 2011-11-25 NOTE — ED Notes (Signed)
Pt states at 19:45 she went to get something from the fridge and she grabbed the soda can with her right hand switched it to her left hand and dropped it. Pt states she had a sudden onset of left sided weakness with arm and leg. Pt states that she was able to walk with assistance to the car.

## 2011-11-25 NOTE — ED Provider Notes (Signed)
History     CSN: [id number]  Arrival date & time 11/25/11  2143   None     Chief Complaint  Patient presents with  . Code Stroke    (Consider location/radiation/quality/duration/timing/severity/associated sxs/prior treatment) HPI Comments: 66yo CF with PMH significant for HTN, HLD, atrial fibrillation, and multiple prior CVAs mostly affecting her left side who presents to the ED tonight due to new onset weakness and numbness on left side of body.   Patient is a 66 y.o. female presenting with neurologic complaint. The history is provided by the patient and medical records.  Neurologic Problem The primary symptoms include dizziness, paresthesias (left side), focal weakness (left face, arm and leg), loss of sensation (left face arm and leg) and speech change (mild difficulty with speech). Primary symptoms do not include headaches, syncope, loss of consciousness, altered mental status, seizures, visual change, fever, nausea or vomiting. Episode onset: about 2.5-3 hours PTA. The symptoms are unchanged. The neurological symptoms are focal. Context: no recent trauma   Dizziness also occurs with weakness (left side). Dizziness does not occur with tinnitus, nausea or vomiting.  Additional symptoms include weakness (left side) and loss of balance. Additional symptoms do not include neck stiffness, pain, leg pain, photophobia, nystagmus, taste disturbance, hearing loss or tinnitus. Medical issues also include cerebral vascular accident (multiple) and hypertension. Medical issues do not include seizures. Workup history includes CT scan.    Past Medical History  Diagnosis Date  . Atrial fibrillation     chronic anticoag  . PULMONARY EMBOLISM, HX OF 2001  . ASTHMA   . CONGESTIVE HEART FAILURE   . CHRONIC OBSTRUCTIVE PULMONARY DISEASE     ongoing tobacco  . DEPRESSION   . CARDIAC MURMUR   . HYPERLIPIDEMIA   . HYPERTENSION     off meds since 2011 due to sx hypotension  . POSTMENOPAUSAL  OSTEOPOROSIS     intol fosamax (noncomp), reclast started 12/2010  . CVA WITH LEFT HEMIPARESIS 1995  . TOBACCO ABUSE   . RHEUMATIC FEVER, HX OF     Past Surgical History  Procedure Date  . Appendectomy   . Gastric bypass 06/2004    done in [place]- lap Roux n-y  . Tonsillectomy     Family History  Problem Relation Age of Onset  . Cancer Neg Hx   . Diabetes Neg Hx   . Coronary artery disease Neg Hx   . Hypertension Other     History  Substance Use Topics  . Smoking status: Current Everyday Smoker    Types: Cigarettes  . Smokeless tobacco: Not on file  . Alcohol Use: No    OB History    Grav Para Term Preterm Abortions TAB SAB Ect Mult Living                  Review of Systems  Constitutional: Negative for fever, chills, activity change, appetite change and fatigue.  HENT: Negative for hearing loss, congestion, sore throat, rhinorrhea, neck pain, neck stiffness and tinnitus.   Eyes: Negative for photophobia, redness and visual disturbance.  Respiratory: Negative for cough, shortness of breath and wheezing.   Cardiovascular: Negative for chest pain, palpitations, leg swelling and syncope.  Gastrointestinal: Negative for nausea, vomiting, abdominal pain, diarrhea, constipation and blood in stool.  Genitourinary: Negative for dysuria, urgency, hematuria and flank pain.  Musculoskeletal: Negative for back pain.  Skin: Negative for rash and wound.  Neurological: Positive for dizziness, speech change (mild difficulty with speech), focal weakness (left face, arm  and leg), facial asymmetry, speech difficulty (mild), weakness (left side), light-headedness, paresthesias (left side) and loss of balance. Negative for seizures, loss of consciousness, numbness and headaches.  Psychiatric/Behavioral: Negative for confusion and altered mental status.  All other systems reviewed and are negative.    Allergies  Review of patient's allergies indicates no known allergies.  Home  Medications   Current Outpatient Rx  Name Route Sig Dispense Refill  . VITAMIN D 1000 UNITS PO TABS Oral Take 1 tablet (1,000 Units total) by mouth daily. 90 tablet 3  . CYANOCOBALAMIN 1000 MCG/ML IJ SOLN Intramuscular Inject 1 mL (1,000 mcg total) into the muscle every 30 (thirty) days. 1 mL 12  . FUROSEMIDE 80 MG PO TABS  TAKE 1 TABLET TWICE A DAY OR AS DIRECTED 60 tablet 3  . GEMFIBROZIL 600 MG PO TABS Oral Take 600 mg by mouth 2 (two) times daily before a meal.      . METOPROLOL SUCCINATE ER 100 MG PO TB24  TAKE 1 TABLET BY MOUTH DAILY 30 tablet 5  . POTASSIUM CHLORIDE CRYS ER 20 MEQ PO TBCR Oral Take 1 tablet (20 mEq total) by mouth daily. 30 tablet 3  . SIMVASTATIN 10 MG PO TABS Oral Take 1 tablet (10 mg total) by mouth at bedtime. 90 tablet 1  . WARFARIN SODIUM 5 MG PO TABS Oral Take 1 tablet (5 mg total) by mouth as directed. 90 tablet 1  . ZOLEDRONIC ACID 5 MG/100ML IV SOLN Intravenous Inject 5 mg into the vein once.        There were no vitals taken for this visit.  Physical Exam  Nursing note and vitals reviewed. Constitutional: She is oriented to person, place, and time. She appears well-developed and well-nourished.  Non-toxic appearance. No distress.  HENT:  Head: Normocephalic and atraumatic.  Mouth/Throat: Oropharynx is clear and moist.  Eyes: Conjunctivae and EOM are normal. Pupils are equal, round, and reactive to light. No scleral icterus.  Neck: Normal range of motion and full passive range of motion without pain. Neck supple. Normal carotid pulses and no JVD present. Carotid bruit is not present.  Cardiovascular: Normal rate, regular rhythm, normal heart sounds and intact distal pulses.   No murmur heard. Pulmonary/Chest: Effort normal and breath sounds normal. No respiratory distress. She has no wheezes. She has no rales.  Abdominal: Soft. Bowel sounds are normal. She exhibits no distension. There is no tenderness. There is no rebound and no guarding.    Musculoskeletal: Normal range of motion.  Neurological: She is alert and oriented to person, place, and time. GCS eye subscore is 4. GCS verbal subscore is 5. GCS motor subscore is 6.       Left lower facial motor weakness. Slight dysarthria. LUE strength 3-4/5. LLE strength 3/5. Patient can perceive light touch to left side of body but feels tingling. + pronator drift on left side.   Skin: Skin is warm and dry. No rash noted. She is not diaphoretic.  Psychiatric: She has a normal mood and affect.    ED Course  Procedures (including critical care time)   Labs Reviewed  PROTIME-INR  APTT  CBC  DIFFERENTIAL  COMPREHENSIVE METABOLIC PANEL  CK TOTAL AND CKMB  TROPONIN I   No results found.   1. Acute left-sided weakness   2. Left arm numbness   3. Left leg numbness   4. Left facial numbness       MDM  At 9:52 PM Pt seen and evaluated with  myself and Dr. Lynelle Doctor.   66yo CF with PMH significant for HTN, HLD, atrial fibrillation, and multiple prior CVAs mostly affecting her left side who presents to the ED tonight due to new onset weakness and numbness on left side of body. Pt made code stroke as onset about 2.5-3h PTA. Pt on coumadin due to afib. Likely not a TPA candidate. Pt mentating normally. Does have left sided weakness and altered sensation on left side.   At 10:20 PM stroke RN in to see pt.   CT without evidence of bleed or acute ischemic changes. HR 120s irreg/irreg c/w known afib. Giving bolus IVF and giving 10mg  dilt.   Neuro recs no TPA. Recs admit hospitalist. hospitalist consulted by Dr Lynelle Doctor.      Verne Carrow, MD 11/25/11 [phone]

## 2011-11-25 NOTE — ED Provider Notes (Signed)
Pt presents with acute numbness and weakness.  Pt symptoms started within the last three hours.  Code stroke has been called but she will not be a candidate for TPA because of her coumadin.  On exam pt has LUE weakness.   ?dysarthria.  Will proceed with evaluation.  10:31 PM Discussed with stroke team.  Pt likely will not be a tpa candidate even if her INR is sub therapeutic due to the mild nature of her symptoms and the fact that at baseline she has mild left hemiparesis which is where her symptoms are currently present.  I saw and evaluated the patient, reviewed the resident's note and I agree with the findings and plan.   Date: 11/25/2011  Rate: 126  Rhythm: atrial fibrillation  QRS Axis: normal  Intervals: normal  ST/T Wave abnormalities: normal  Conduction Disutrbances:none  Narrative Interpretation:   Old EKG Reviewed: changes noted rate faster    Celene Kras, MD 11/25/11 2300

## 2011-11-26 ENCOUNTER — Telehealth: Payer: Self-pay

## 2011-11-26 ENCOUNTER — Observation Stay (HOSPITAL_COMMUNITY): Payer: Medicare Other

## 2011-11-26 ENCOUNTER — Encounter (HOSPITAL_COMMUNITY): Payer: Self-pay | Admitting: General Practice

## 2011-11-26 DIAGNOSIS — R200 Anesthesia of skin: Secondary | ICD-10-CM

## 2011-11-26 DIAGNOSIS — R531 Weakness: Secondary | ICD-10-CM

## 2011-11-26 DIAGNOSIS — I059 Rheumatic mitral valve disease, unspecified: Secondary | ICD-10-CM

## 2011-11-26 LAB — HEMOGLOBIN A1C
Hgb A1c MFr Bld: 5.8 % — ABNORMAL HIGH (ref <5.7)
Mean Plasma Glucose: 120 mg/dL — ABNORMAL HIGH (ref <117)

## 2011-11-26 LAB — LIPID PANEL
Cholesterol: 128 mg/dL (ref 0–200)
Total CHOL/HDL Ratio: 2.9 RATIO

## 2011-11-26 MED ORDER — FUROSEMIDE 80 MG PO TABS
80.0000 mg | ORAL_TABLET | Freq: Two times a day (BID) | ORAL | Status: DC
  Administered 2011-11-26: 80 mg via ORAL
  Filled 2011-11-26 (×3): qty 1

## 2011-11-26 MED ORDER — FUROSEMIDE 80 MG PO TABS
80.0000 mg | ORAL_TABLET | Freq: Every day | ORAL | Status: DC
  Administered 2011-11-27 – 2011-11-28 (×2): 80 mg via ORAL
  Filled 2011-11-26 (×2): qty 1

## 2011-11-26 MED ORDER — WHITE PETROLATUM GEL
Status: AC
  Administered 2011-11-26: 19:00:00
  Filled 2011-11-26: qty 5

## 2011-11-26 MED ORDER — SODIUM CHLORIDE 0.9 % IV SOLN
INTRAVENOUS | Status: DC
  Administered 2011-11-26: 02:00:00 via INTRAVENOUS

## 2011-11-26 MED ORDER — IPRATROPIUM BROMIDE 0.02 % IN SOLN
0.5000 mg | RESPIRATORY_TRACT | Status: DC | PRN
Start: 2011-11-26 — End: 2011-11-28

## 2011-11-26 MED ORDER — ALBUTEROL SULFATE (5 MG/ML) 0.5% IN NEBU
2.5000 mg | INHALATION_SOLUTION | RESPIRATORY_TRACT | Status: DC | PRN
Start: 2011-11-26 — End: 2011-11-28
  Administered 2011-11-26: 2.5 mg via RESPIRATORY_TRACT
  Filled 2011-11-26: qty 0.5

## 2011-11-26 MED ORDER — SIMVASTATIN 10 MG PO TABS
10.0000 mg | ORAL_TABLET | Freq: Every day | ORAL | Status: DC
  Administered 2011-11-26 – 2011-11-27 (×2): 10 mg via ORAL
  Filled 2011-11-26 (×3): qty 1

## 2011-11-26 MED ORDER — WARFARIN SODIUM 5 MG PO TABS
5.0000 mg | ORAL_TABLET | Freq: Once | ORAL | Status: AC
  Administered 2011-11-26: 5 mg via ORAL
  Filled 2011-11-26: qty 1

## 2011-11-26 MED ORDER — VITAMIN D3 25 MCG (1000 UNIT) PO TABS
1000.0000 [IU] | ORAL_TABLET | Freq: Every day | ORAL | Status: DC
  Administered 2011-11-26 – 2011-11-27 (×2): 1000 [IU] via ORAL
  Filled 2011-11-26 (×3): qty 1

## 2011-11-26 MED ORDER — SENNOSIDES-DOCUSATE SODIUM 8.6-50 MG PO TABS: 1.0000 | ORAL_TABLET | Freq: Every evening | ORAL | Status: DC | PRN

## 2011-11-26 MED ORDER — ASPIRIN 300 MG RE SUPP: 300.0000 mg | Freq: Every day | RECTAL | Status: DC

## 2011-11-26 MED ORDER — ACETAMINOPHEN 325 MG PO TABS
ORAL_TABLET | ORAL | Status: AC
  Filled 2011-11-26: qty 2

## 2011-11-26 MED ORDER — ONDANSETRON HCL 4 MG/2ML IJ SOLN: 4.0000 mg | Freq: Four times a day (QID) | INTRAMUSCULAR | Status: DC | PRN

## 2011-11-26 MED ORDER — IPRATROPIUM BROMIDE 0.02 % IN SOLN
0.5000 mg | RESPIRATORY_TRACT | Status: DC
  Administered 2011-11-26 (×2): 0.5 mg via RESPIRATORY_TRACT
  Filled 2011-11-26 (×2): qty 2.5

## 2011-11-26 MED ORDER — POTASSIUM CHLORIDE CRYS ER 20 MEQ PO TBCR
20.0000 meq | EXTENDED_RELEASE_TABLET | Freq: Every day | ORAL | Status: DC
  Administered 2011-11-26 – 2011-11-28 (×3): 20 meq via ORAL
  Filled 2011-11-26 (×3): qty 1

## 2011-11-26 MED ORDER — ZOLPIDEM TARTRATE 5 MG PO TABS
5.0000 mg | ORAL_TABLET | Freq: Every evening | ORAL | Status: DC | PRN
  Administered 2011-11-26 – 2011-11-27 (×2): 5 mg via ORAL
  Filled 2011-11-26 (×2): qty 1

## 2011-11-26 MED ORDER — ASPIRIN 325 MG PO TABS
325.0000 mg | ORAL_TABLET | Freq: Every day | ORAL | Status: DC
  Administered 2011-11-26 – 2011-11-28 (×2): 325 mg via ORAL
  Filled 2011-11-26 (×3): qty 1

## 2011-11-26 MED ORDER — METOPROLOL SUCCINATE ER 100 MG PO TB24
100.0000 mg | ORAL_TABLET | Freq: Every day | ORAL | Status: DC
  Administered 2011-11-26 – 2011-11-28 (×3): 100 mg via ORAL
  Filled 2011-11-26 (×3): qty 1

## 2011-11-26 MED ORDER — ACETAMINOPHEN 325 MG PO TABS
650.0000 mg | ORAL_TABLET | Freq: Four times a day (QID) | ORAL | Status: AC | PRN
  Administered 2011-11-26: 650 mg via ORAL

## 2011-11-26 NOTE — Telephone Encounter (Signed)
Ideally would need OV today, or UC or ER

## 2011-11-26 NOTE — H&P (Signed)
Yvonne Collier is an 66 y.o. female.   Chief Complaint: LUE weakness HPI: 66 Yo with history of CVA involving the left hemiparesis here with another episode of worsening weakness, tingling and numbness of her left upper extremity. She was seen in ED and Neurology consulted. She was found not to be a candidate for TPA and also having symptoms on the side of previous stroke. Neurology advised that she be admitted for re-work up.  Past Medical History  Diagnosis Date  . Atrial fibrillation     chronic anticoag  . PULMONARY EMBOLISM, HX OF 2001  . ASTHMA   . CONGESTIVE HEART FAILURE   . CHRONIC OBSTRUCTIVE PULMONARY DISEASE     ongoing tobacco  . DEPRESSION   . CARDIAC MURMUR   . HYPERLIPIDEMIA   . HYPERTENSION     off meds since 2011 due to sx hypotension  . POSTMENOPAUSAL OSTEOPOROSIS     intol fosamax (noncomp), reclast started 12/2010  . TOBACCO ABUSE   . RHEUMATIC FEVER, HX OF   . Stroke 1995    residual left hemiparesis  . Shortness of breath on exertion   . Arthritis   . Chronic high back pain     Past Surgical History  Procedure Date  . Gastric bypass 06/2004    done in [place]- lap Roux n-y  . Tonsillectomy     "when I was real young"  . Appendectomy 1970's    Family History  Problem Relation Age of Onset  . Cancer Neg Hx   . Diabetes Neg Hx   . Coronary artery disease Neg Hx   . Hypertension Other    Social History:  reports that she has been smoking Cigarettes.  She has a 21.5 pack-year smoking history. She has never used smokeless tobacco. She reports that she does not drink alcohol or use illicit drugs.  Allergies: No Known Allergies  Medications Prior to Admission  Medication Dose Route Frequency Provider Last Rate Last Dose  . acetaminophen (TYLENOL) tablet 650 mg  650 mg Oral Q6H PRN Scarlette Calico C. [place], [place]   650 mg at 11/26/11 0035  . diltiazem (CARDIZEM) injection 10 mg  10 mg Intravenous Once Verne Carrow, MD   10 mg at 11/25/11 2251  .  sodium chloride 0.9 % bolus 1,000 mL  1,000 mL Intravenous Once Verne Carrow, MD   1,000 mL at 11/25/11 2247   Medications Prior to Admission  Medication Sig Dispense Refill  . cholecalciferol (VITAMIN D) 1000 UNITS tablet Take 1,000 Units by mouth at bedtime.      . furosemide (LASIX) 80 MG tablet Take 80 mg by mouth 2 (two) times daily.       . metoprolol succinate (TOPROL-XL) 100 MG 24 hr tablet Take 100 mg by mouth daily.       . potassium chloride SA (K-DUR,KLOR-CON) 20 MEQ tablet Take 20 mEq by mouth daily.      . simvastatin (ZOCOR) 10 MG tablet Take 10 mg by mouth at bedtime.      Marland Kitchen warfarin (COUMADIN) 5 MG tablet Take 2.5-5 mg by mouth as directed. Take 5mg  on Mon, Tues,Thur, Sat & on Wed & Sun take 2.5mg         Results for orders placed during the hospital encounter of 11/25/11 (from the past 48 hour(s))  PROTIME-INR     Status: Abnormal   Collection Time   11/25/11  9:49 PM      Component Value Range Comment   Prothrombin  Time 23.6 (*) 11.6 - 15.2 (seconds)    INR 2.06 (*) 0.00 - 1.49    APTT     Status: Abnormal   Collection Time   11/25/11  9:49 PM      Component Value Range Comment   aPTT 44 (*) 24 - 37 (seconds)   CBC     Status: Normal   Collection Time   11/25/11  9:49 PM      Component Value Range Comment   WBC 9.9  4.0 - 10.5 (K/uL)    RBC 4.90  3.87 - 5.11 (MIL/uL)    Hemoglobin 14.8  12.0 - 15.0 (g/dL)    HCT 11.9  14.7 - 82.9 (%)    MCV 90.4  78.0 - 100.0 (fL)    MCH 30.2  26.0 - 34.0 (pg)    MCHC 33.4  30.0 - 36.0 (g/dL)    RDW 56.2  13.0 - 86.5 (%)    Platelets 252  150 - 400 (K/uL)   DIFFERENTIAL     Status: Normal   Collection Time   11/25/11  9:49 PM      Component Value Range Comment   Neutrophils Relative 70  43 - 77 (%)    Neutro Abs 6.9  1.7 - 7.7 (K/uL)    Lymphocytes Relative 21  12 - 46 (%)    Lymphs Abs 2.1  0.7 - 4.0 (K/uL)    Monocytes Relative 7  3 - 12 (%)    Monocytes Absolute 0.7  0.1 - 1.0 (K/uL)    Eosinophils Relative 2  0 - 5  (%)    Eosinophils Absolute 0.2  0.0 - 0.7 (K/uL)    Basophils Relative 0  0 - 1 (%)    Basophils Absolute 0.0  0.0 - 0.1 (K/uL)   COMPREHENSIVE METABOLIC PANEL     Status: Abnormal   Collection Time   11/25/11  9:49 PM      Component Value Range Comment   Sodium 138  135 - 145 (mEq/L)    Potassium 3.5  3.5 - 5.1 (mEq/L)    Chloride 97  96 - 112 (mEq/L)    CO2 28  19 - 32 (mEq/L)    Glucose, Bld 115 (*) 70 - 99 (mg/dL)    BUN 13  6 - 23 (mg/dL)    Creatinine, Ser 7.84  0.50 - 1.10 (mg/dL)    Calcium 9.2  8.4 - 10.5 (mg/dL)    Total Protein 7.5  6.0 - 8.3 (g/dL)    Albumin 4.0  3.5 - 5.2 (g/dL)    AST 24  0 - 37 (U/L)    ALT 15  0 - 35 (U/L)    Alkaline Phosphatase 72  39 - 117 (U/L)    Total Bilirubin 0.6  0.3 - 1.2 (mg/dL)    GFR calc non Af Amer >90  >90 (mL/min)    GFR calc Af Amer >90  >90 (mL/min)   CK TOTAL AND CKMB     Status: Normal   Collection Time   11/25/11  9:49 PM      Component Value Range Comment   Total CK 62  7 - 177 (U/L)    CK, MB 1.8  0.3 - 4.0 (ng/mL)    Relative Index RELATIVE INDEX IS INVALID  0.0 - 2.5    TROPONIN I     Status: Normal   Collection Time   11/25/11  9:49 PM      Component Value Range  Comment   Troponin I <0.30  <0.30 (ng/mL)   POCT I-STAT, CHEM 8     Status: Abnormal   Collection Time   11/25/11 10:03 PM      Component Value Range Comment   Sodium 140  135 - 145 (mEq/L)    Potassium 3.3 (*) 3.5 - 5.1 (mEq/L)    Chloride 100  96 - 112 (mEq/L)    BUN 12  6 - 23 (mg/dL)    Creatinine, Ser 4.09  0.50 - 1.10 (mg/dL)    Glucose, Bld 811 (*) 70 - 99 (mg/dL)    Calcium, Ion 9.14 (*) 1.12 - 1.32 (mmol/L)    TCO2 29  0 - 100 (mmol/L)    Hemoglobin 16.0 (*) 12.0 - 15.0 (g/dL)    HCT 78.2 (*) 95.6 - 46.0 (%)    Dg Chest 2 View  11/25/2011  *RADIOLOGY REPORT*  Clinical Data: Shortness of breath  CHEST - 2 VIEW  Comparison: CT chest dated 10/02/2005  Findings: Chronic interstitial markings.  No pleural effusion or pneumothorax.  Stable  cardiomegaly.  Degenerative changes of the visualized thoracolumbar spine.  IMPRESSION: No evidence of acute cardiopulmonary disease.  Stable cardiomegaly with chronic interstitial markings.  Original Report Authenticated By: Charline Bills, M.D.   Ct Head Wo Contrast  11/25/2011  *RADIOLOGY REPORT*  Clinical Data: Code stroke; sudden onset of left-sided weakness.  CT HEAD WITHOUT CONTRAST  Technique:  Contiguous axial images were obtained from the base of the skull through the vertex without contrast.  Comparison: None.  Findings: There is no evidence of acute infarction, mass lesion, or intra- or extra-axial hemorrhage on CT.  There is chronic encephalomalacia at the right frontoparietal region, reflecting remote infarct.  In addition, there is a chronic lacunar infarct within the left caudate.  Mild cerebellar atrophy is noted.  Scattered periventricular and subcortical white matter change likely reflects small vessel ischemic microangiopathy.  The brainstem and fourth ventricle are within normal limits.  The third and lateral ventricles are unremarkable in appearance.  The cerebral hemispheres demonstrate grossly normal gray-white differentiation.  No mass effect or midline shift is seen.  There is no evidence of fracture; visualized osseous structures are unremarkable in appearance.  The visualized portions of the orbits are within normal limits.  The paranasal sinuses and mastoid air cells are well-aerated.  No significant soft tissue abnormalities are seen.  IMPRESSION:  1.  No acute of free pathology seen on CT. 2.  Chronic encephalomalacia at the right frontoparietal region, reflecting remote infarct. 3.  Chronic lacunar infarct in the left caudate. 4.  Scattered small vessel ischemic microangiopathy.  Critical Value/emergent results were called by telephone at the time of interpretation on 11/25/2011  at 10:07 p.m.  to  Dr. Linwood Dibbles, who verbally acknowledged these results.  Original Report  Authenticated By: Tonia Ghent, M.D.    Review of Systems  HENT: Negative.   Eyes: Negative.   Respiratory: Negative.   Cardiovascular: Negative.   Gastrointestinal: Negative.   Genitourinary: Negative.   Musculoskeletal: Negative.   Skin: Negative.   Neurological: Positive for speech change, focal weakness and weakness. Negative for dizziness, tingling, seizures and loss of consciousness.  Endo/Heme/Allergies: Negative.   Psychiatric/Behavioral: Negative.     Blood pressure 137/81, pulse 85, temperature 97.4 F (36.3 C), temperature source Oral, resp. rate 22, height 5\' 4"  (1.626 m), weight 72.6 kg (160 lb 0.9 oz), SpO2 96.00%. Physical Exam  Constitutional: She is oriented to person, place, and time. She  appears well-developed and well-nourished.  HENT:  Head: Normocephalic and atraumatic.  Right Ear: External ear normal.  Left Ear: External ear normal.  Mouth/Throat: Oropharynx is clear and moist.  Eyes: Conjunctivae and EOM are normal. Pupils are equal, round, and reactive to light.  Neck: Normal range of motion.  Cardiovascular: Normal rate, regular rhythm, normal heart sounds and intact distal pulses.   Respiratory: Effort normal and breath sounds normal.  GI: Soft. Bowel sounds are normal.  Musculoskeletal: Normal range of motion.  Neurological: She is alert and oriented to person, place, and time. She has normal reflexes. She exhibits abnormal muscle tone. She displays a negative Romberg sign.       LUE weakness mainly  Skin: Skin is warm and dry.  Psychiatric: She has a normal mood and affect. Her behavior is normal. Judgment and thought content normal.     Assessment/Plan 1. CVA: This involves previous side. She is on Coumadin and therapeutic INR. Not much could therefore be done except to check MRI, Pt/OT consult and repeat Echo and Carotid Dopplers. 2. HTN: Controlled. In setting of CVA, will allow slightly higher BP 3. Tobacco abuse: Counseled 4. Afib: rate  controlled, INr therapeutic.  Rahcel Shutes,LAWAL 11/26/2011, 1:45 AM

## 2011-11-26 NOTE — Progress Notes (Signed)
Bilateral carotid artery duplex completed.  Preliminary report is no evidence of significant ICA stenosis. Smiley Houseman 11/26/2011, 12:58 PM

## 2011-11-26 NOTE — Progress Notes (Signed)
Physical Therapy Evaluation Patient Details Name: Yvonne Collier MRN: [id number] DOB: [birth year] Today's Date: 11/26/2011  Problem List:  Patient Active Problem List  Diagnoses  . PLANTAR WART, RIGHT  . HYPERLIPIDEMIA  . TOBACCO ABUSE  . DEPRESSION  . HYPERTENSION  . Atrial fibrillation  . CONGESTIVE HEART FAILURE  . CVA WITH LEFT HEMIPARESIS  . ASTHMA  . CHRONIC OBSTRUCTIVE PULMONARY DISEASE  . BACK PAIN, THORACIC REGION  . POSTMENOPAUSAL OSTEOPOROSIS  . CARDIAC MURMUR  . CEREBROVASCULAR ACCIDENT, HX OF  . PULMONARY EMBOLISM, HX OF  . RHEUMATIC FEVER, HX OF  . CHRONIC OBSTRUCTIVE PULMONARY DISEASE, ACUTE EXACERBATION  . Vitamin B12 deficiency (dietary) anemia  . Acute left-sided weakness  . Left arm numbness    Past Medical History:  Past Medical History  Diagnosis Date  . Atrial fibrillation     chronic anticoag  . PULMONARY EMBOLISM, HX OF 2001  . ASTHMA   . CONGESTIVE HEART FAILURE   . CHRONIC OBSTRUCTIVE PULMONARY DISEASE     ongoing tobacco  . DEPRESSION   . CARDIAC MURMUR   . HYPERLIPIDEMIA   . HYPERTENSION     off meds since 2011 due to sx hypotension  . POSTMENOPAUSAL OSTEOPOROSIS     intol fosamax (noncomp), reclast started 12/2010  . TOBACCO ABUSE   . RHEUMATIC FEVER, HX OF   . Stroke 1995    residual left hemiparesis  . Shortness of breath on exertion   . Arthritis   . Chronic high back pain    Past Surgical History:  Past Surgical History  Procedure Date  . Gastric bypass 06/2004    done in [place]- lap Roux n-y  . Tonsillectomy     "when I was real young"  . Appendectomy 1970's    PT Assessment/Plan/Recommendation PT Assessment Clinical Impression Statement: Pt is now s/p multiple Rt small cerebral infarcts surrounding area of previous chronic infarct.  She reports her Lt side has improved and is not yet back to her baseline.  She provides care during the day for her father who uses a w/c--? her retired brother can come and assist  as she recovers. PT Recommendation/Assessment: Patient will need skilled PT in the acute care venue PT Problem List: Decreased strength;Decreased activity tolerance;Decreased balance;Decreased mobility;Decreased knowledge of use of DME;Cardiopulmonary status limiting activity;Impaired sensation Barriers to Discharge: Decreased caregiver support Barriers to Discharge Comments: and pt helps care for her father PT Therapy Diagnosis : Difficulty walking;Hemiplegia non-dominant side PT Plan PT Frequency: Min 4X/week PT Treatment/Interventions: DME instruction;Gait training;Stair training;Patient/family education;Functional mobility training;Therapeutic activities;Balance training;Neuromuscular re-education PT Recommendation Follow Up Recommendations: Home health PT Equipment Recommended: None recommended by PT PT Goals  Acute Rehab PT Goals PT Goal Formulation: With patient Pt will Roll Supine to Right Side: with modified independence PT Goal: Rolling Supine to Right Side - Progress: Goal set today Pt will go Supine/Side to Sit: with modified independence;with HOB 0 degrees PT Goal: Supine/Side to Sit - Progress: Goal set today Pt will go Sit to Supine/Side: with modified independence;with HOB 0 degrees PT Goal: Sit to Supine/Side - Progress: Goal set today Pt will go Sit to Stand: with modified independence;with upper extremity assist PT Goal: Sit to Stand - Progress: Goal set today Pt will go Stand to Sit: with modified independence;with upper extremity assist PT Goal: Stand to Sit - Progress: Goal set today Pt will Ambulate: 51 - 150 feet;with supervision;with least restrictive assistive device PT Goal: Ambulate - Progress: Goal set today Pt will Go  Up / Down Stairs: 3-5 stairs;with supervision;with rail(s) PT Goal: Up/Down Stairs - Progress: Goal set today  PT Evaluation Precautions/Restrictions  Precautions Precautions: Fall Required Braces or Orthoses: No Restrictions Weight  Bearing Restrictions: No Prior Functioning  Home Living Lives With: Family (brother (works 7-3), father (requires assist, in w/c)) Receives Help From: Family (another brother is retired & may help c dad in a.m.) Type of Home: House Home Layout: One level Home Access: Stairs to enter Entrance Stairs-Rails: Right Entrance Stairs-Number of Steps: 3 Bathroom Shower/Tub: Forensic scientist: Standard Bathroom Accessibility: Yes How Accessible: Accessible via wheelchair Home Adaptive Equipment: Bedside commode/3-in-1;Grab bars around toilet;Grab bars in shower;Hand-held shower hose;Straight cane Additional Comments: father uses 3n1 in tub as a shower seat (they share a bathroom); pt reports she provides stand-by assistance during her father's transfers in/out of w/c, he self-propels w/c, he needs assist with bathing (her brother does this) and dressing Prior Function Level of Independence: Independent with basic ADLs;Independent with homemaking with ambulation;Independent with gait Driving: Yes Vocation: On disability Comments: reports she had no residual weakness in LLE from prior CVA "except when it was cold out, and then my foot would drag some." Cognition Cognition Arousal/Alertness: Lethargic (pt awake for >24 hrs at time of eval) Overall Cognitive Status: Appears within functional limits for tasks assessed Orientation Level: Oriented X4 Sensation/Coordination Sensation Light Touch: Impaired by gross assessment (pt reports numbness hand>leg) Coordination Gross Motor Movements are Fluid and Coordinated: Yes (LLE) Extremity Assessment RLE Assessment RLE Assessment: Within Functional Limits LLE Assessment LLE Assessment: Exceptions to [facility] LLE Strength LLE Overall Strength: Deficits LLE Overall Strength Comments: full AROM; hip flexion 3+/5; knee extension 3+/5; knee flexion 3+/5;ankle dorsiflexion 4/5 Mobility (including Balance) Bed Mobility Rolling Right: 5:  Supervision;With rail Rolling Right Details (indicate cue type and reason): uses RUE on rail to roll Rt; supervision due to IV and monitor lines Right Sidelying to Sit: 5: Supervision;With rails;HOB flat Right Sidelying to Sit Details (indicate cue type and reason): uses RUE on rail; Supervision for safety (reported dizziness upon sitting and has IV and monitor) Sit to Supine: 5: Supervision;HOB flat Sit to Supine - Details (indicate cue type and reason): Pt impulsively lying down without regard for IV tubing (despite numerous conversations during session re: protecting IV due to pt is a difficult "stick"). Transfers Sit to Stand: 5: Supervision;With upper extremity assist;Without upper extremity assist;With armrests;From bed;From chair/3-in-1;From toilet Sit to Stand Details (indicate cue type and reason): repeated x4 from various surfaces; pt has to use momentum to come to standing if she does not use her UEs to assist; Supervision for safety Stand to Sit: 5: Supervision;With upper extremity assist;Without upper extremity assist;With armrests;To bed;To chair/3-in-1;To toilet Ambulation/Gait Ambulation/Gait: Yes Ambulation/Gait Assistance: 4: Min assist Ambulation/Gait Assistance Details (indicate cue type and reason): slightly impulsive and not mindful of IV line and cardiac monitor cord (nearly stepped/tripped on); slight lean to Lt during gait; limited distance due to HR incr 134 Ambulation Distance (Feet): 55 Feet (10 x1; 15' x3 no device; ) Assistive device: None Gait Pattern: Decreased stride length;Lateral trunk lean to left  Posture/Postural Control Posture/Postural Control: No significant limitations Balance Balance Assessed: Yes Static Standing Balance Static Standing - Balance Support: No upper extremity supported Static Standing - Level of Assistance: 5: Stand by assistance Static Standing - Comment/# of Minutes: at least 1 minute Single Leg Stance - Right Leg: 6   (seconds) Single Leg Stance - Left Leg: 0  Tandem Stance - Right Leg:  30  Rhomberg - Eyes Opened: 30  Rhomberg - Eyes Closed: 20  (incr lean to Lt) [employer] Test Sit to Stand: Able to stand  independently using hands Standing Unsupported: Able to stand 2 minutes with supervision (dizziness upon standing) Sitting with Back Unsupported but Feet Supported on Floor or Stool: Able to sit safely and securely 2 minutes Stand to Sit: Sits safely with minimal use of hands Transfers: Able to transfer safely, definite need of hands Standing Unsupported with Eyes Closed: Able to stand 10 seconds with supervision Standing Ubsupported with Feet Together: Able to place feet together independently and stand 1 minute safely From Standing, Reach Forward with Outstretched Arm: Can reach confidently >25 cm (10") From Standing Position, Pick up Object from Floor:  (not tested due to fatigue, Incr HR, and need to use restroom) From Standing Position, Turn to Look Behind Over each Shoulder: Looks behind from both sides and weight shifts well Turn 360 Degrees: Able to turn 360 degrees safely in 4 seconds or less Standing Unsupported, Alternately Place Feet on Step/Stool: Able to complete 4 steps without aid or supervision Standing Unsupported, One Foot in Front: Able to plae foot ahead of the other independently and hold 30 seconds Standing on One Leg: Able to lift leg independently and hold 5-10 seconds Exercise    End of Session PT - End of Session Equipment Utilized During Treatment: Gait belt Activity Tolerance: Patient limited by fatigue;Treatment limited secondary to medical complications (Comment) (incr HR) Patient left: in bed;with call bell in reach Nurse Communication: Other (comment);Mobility status for transfers (pt reports has been walking to bathroom with nursing) General Behavior During Session: [facility] for tasks performed Cognition: [facility] for tasks performed  Aundra Pung 11/26/2011, 3:06  PM Pager [phone]

## 2011-11-26 NOTE — Progress Notes (Signed)
ANTICOAGULATION CONSULT NOTE - Initial Consult  Pharmacy Consult for Warfarin Indication: atrial fibrillation  No Known Allergies  Patient Measurements: Height: 5\' 4"  (162.6 cm) Weight: 160 lb 0.9 oz (72.6 kg) IBW/kg (Calculated) : 54.7   Vital Signs: Temp: 97.7 F (36.5 C) (01/18 1300) Temp src: Oral (01/18 1300) BP: 114/67 mmHg (01/18 1300) Pulse Rate: 84  (01/18 1340)  Labs:  [facility] 11/25/11 2203 11/25/11 2149  HGB 16.0* 14.8  HCT 47.0* 44.3  PLT -- 252  APTT -- 44*  LABPROT -- 23.6*  INR -- 2.06*  HEPARINUNFRC -- --  CREATININE 0.80 0.69  CKTOTAL -- 62  CKMB -- 1.8  TROPONINI -- <0.30   Estimated Creatinine Clearance: 69.4 ml/min (by C-G formula based on Cr of 0.8).  Medical History: Past Medical History  Diagnosis Date  . Atrial fibrillation     chronic anticoag  . PULMONARY EMBOLISM, HX OF 2001  . ASTHMA   . CONGESTIVE HEART FAILURE   . CHRONIC OBSTRUCTIVE PULMONARY DISEASE     ongoing tobacco  . DEPRESSION   . CARDIAC MURMUR   . HYPERLIPIDEMIA   . HYPERTENSION     off meds since 2011 due to sx hypotension  . POSTMENOPAUSAL OSTEOPOROSIS     intol fosamax (noncomp), reclast started 12/2010  . TOBACCO ABUSE   . RHEUMATIC FEVER, HX OF   . Stroke 1995    residual left hemiparesis  . Shortness of breath on exertion   . Arthritis   . Chronic high back pain     Medications:  Scheduled:    . aspirin  300 mg Rectal Daily   Or  . aspirin  325 mg Oral Daily  . cholecalciferol  1,000 Units Oral QHS  . diltiazem  10 mg Intravenous Once  . furosemide  80 mg Oral Daily  . metoprolol succinate  100 mg Oral Daily  . potassium chloride SA  20 mEq Oral Daily  . simvastatin  10 mg Oral QHS  . sodium chloride  1,000 mL Intravenous Once  . white petrolatum      . DISCONTD: furosemide  80 mg Oral BID  . DISCONTD: ipratropium  0.5 mg Nebulization Q4H    Assessment: 66 yo F on Coumadin PTA for afib admitted with acute stoke symptoms.  CT scan negative  for bleed.  To continue Coumadin.  INR yesterday was therapeutic.  Home Coumadin regimen is 5 mg daily except 2.5mg  on Weds and Sun.    Goal of Therapy:  INR 2-3   Plan:  Continue with home dose of Coumadin  5 mg PO x 1 tonight. Daily INR.  [employer], Pharm.D., BCPS Clinical Pharmacist Pager [phone]  11/26/2011,7:57 PM

## 2011-11-26 NOTE — Consult Note (Signed)
[facility] Consult Note  Date: 11/26/2011  Patient name: Yvonne Collier Medical record number: [id number] Date of birth: 07-Jun-1946 Age: 66 y.o. Gender: female   Chief Complaint: LUE weakness  History of Present Illness: Yvonne Collier is an 66 y.o. female with remote R CVA w/ residual L sided weakness, especially LUE.  Around 1900 last pm, pt noticed she was unable to carry a soda can with the left hand.  The hand felt 'rubbery'.  After laying down for an hour, she noticed the left foot to feel 'rubbery'.  She presented to the ER around 2000 and Code Stroke was called, then cancelled, as pt was on Coumadin and had mild presenting symptoms and, thus, not a tPA candidate.  Neuro consult requested.  Presently, pt has mild dysarthria without aphasia or dysphagia and ongoing L hand weakness and LU/LE paresthesia.  She denies HA or change in [place].  Meds Inpatient  Scheduled:   . aspirin  300 mg Rectal Daily   Or  . aspirin  325 mg Oral Daily  . cholecalciferol  1,000 Units Oral QHS  . diltiazem  10 mg Intravenous Once  . furosemide  80 mg Oral BID  . ipratropium  0.5 mg Nebulization Q4H  . metoprolol succinate  100 mg Oral Daily  . potassium chloride SA  20 mEq Oral Daily  . simvastatin  10 mg Oral QHS  . sodium chloride  1,000 mL Intravenous Once    Medication Sig Start Date Taking?  cholecalciferol (VITAMIN D) 1000 UNITS tablet Take 1,000 Units by mouth at bedtime. 06/09/11 Yes  furosemide (LASIX) 80 MG tablet Take 80 mg by mouth 2 (two) times daily.  09/25/11 Yes  metoprolol succinate (TOPROL-XL) 100 MG 24 hr tablet Take 100 mg by mouth daily.  08/30/11 Yes  potassium chloride SA (K-DUR,KLOR-CON) 20 MEQ tablet Take 20 mEq by mouth daily. 03/23/11 Yes  simvastatin (ZOCOR) 10 MG tablet Take 10 mg by mouth at bedtime. 10/28/11 Yes  warfarin (COUMADIN) 5 MG tablet Take 2.5-5 mg by mouth as directed. Take 5mg  on Mon, Tues,Thur, Sat & on Wed & Sun take 2.5mg  10/25/11  Yes     Allergies: Review of patient's allergies indicates no known allergies.  Past Medical History  Diagnosis Date  . Atrial fibrillation     chronic anticoag  . PULMONARY EMBOLISM, HX OF 2001  . ASTHMA   . CONGESTIVE HEART FAILURE   . CHRONIC OBSTRUCTIVE PULMONARY DISEASE     ongoing tobacco  . DEPRESSION   . CARDIAC MURMUR   . HYPERLIPIDEMIA   . HYPERTENSION     off meds since 2011 due to sx hypotension  . POSTMENOPAUSAL OSTEOPOROSIS     intol fosamax (noncomp), reclast started 12/2010  . TOBACCO ABUSE   . RHEUMATIC FEVER, HX OF   . Stroke 1995    residual left hemiparesis  . Shortness of breath on exertion   . Arthritis   . Chronic high back pain    Past Surgical History  Procedure Date  . Gastric bypass 06/2004    done in [place]- lap Roux n-y  . Tonsillectomy     "when I was real young"  . Appendectomy 1970's   Family History  Problem Relation Age of Onset  . Cancer Neg Hx   . Diabetes Neg Hx   . Coronary artery disease Neg Hx   . Hypertension Other    Social History: Smokes cigarettes, reporting a 21.5 pack-year smoking history.  She reports  that she does not drink alcohol or use illicit drugs.  Review of Systems: see HPI.  Has mild SOB and DOE at baseline.  NO chest pain.  Denies fevers or chills.  No syncope or presyncope.  No GI c/o.  Examination:  Blood pressure 120/81, pulse 105, temperature 97.4 F Oral, resp. rate 20, height 5\' 4"  , weight 160 lb , SpO2 94.00%.  In general, pt laying in bed, in NAD.   Cardiovascular: The patient has irregular regular rate and rhythm.    Mental status:   The patient is oriented to person, place and time. Recent and remote memory are intact. Attention span and concentration are normal. Language including repetition, naming, following commands are intact. Fund of knowledge of current and historical events, as well as vocabulary are normal. Mild dysarthria without aphasia.  Cranial Nerves: Pupils are equally  round and reactive to light. Visual fields full to confrontation. Extraocular movements are intact without nystagmus.  Slight L facial droop.   Hearing intact to bilateral finger rub. Tongue protrusion, uvula, palate midline.  Shoulder shrug intact.  Motor: Strength 5/% R U/LE.  L proximal UE 5/5. Wrist dorsiflexion -4/5, L grip slightly weaker than R.  L pronator drift present.  Reflexes:   Biceps  Triceps Brachioradialis Knee Ankle  Right 2+  2+  2+   2+ 2+  Left  2+  2+  2+   2+ 2+  Plantars: down going on right, upgoing on the left.  Coordination:  Normal finger to nose, heel to shin on R.  Dysmetric on L.  Rapid alternating movements impaired on the Left..  Sensation: Diminished sensation on the left throughout.  Had difficulty with discrimination, extinction present on the left.   Labs: PROTIME-INR     Status: Abnormal   11/25/11  9:49 PM   Prothrombin Time 23.6 (*) 11.6 - 15.2 (seconds)    INR 2.06 (*) 0.00 - 1.49    APTT     Status: Abnormal   11/25/11  9:49 PM   aPTT 44 (*) 24 - 37 (seconds)   CBC     Status: Normal   11/25/11  9:49 PM   WBC 9.9  4.0 - 10.5 (K/uL)    RBC 4.90  3.87 - 5.11 (MIL/uL)    Hemoglobin 14.8  12.0 - 15.0 (g/dL)    HCT 16.1  09.6 - 04.5 (%)    MCV 90.4  78.0 - 100.0 (fL)    MCH 30.2  26.0 - 34.0 (pg)    MCHC 33.4  30.0 - 36.0 (g/dL)    RDW 40.9  81.1 - 91.4 (%)    Platelets 252  150 - 400 (K/uL)   COMPREHENSIVE METABOLIC PANEL     Status: Abnormal   11/25/11  9:49 PM   Sodium 138  135 - 145 (mEq/L)    Potassium 3.5  3.5 - 5.1 (mEq/L)    Chloride 97  96 - 112 (mEq/L)    CO2 28  19 - 32 (mEq/L)    Glucose, Bld 115 (*) 70 - 99 (mg/dL)    BUN 13  6 - 23 (mg/dL)    Creatinine, Ser 7.82  0.50 - 1.10 (mg/dL)    Calcium 9.2  8.4 - 10.5 (mg/dL)    Total Protein 7.5  6.0 - 8.3 (g/dL)    Albumin 4.0  3.5 - 5.2 (g/dL)    AST 24  0 - 37 (U/L)    ALT 15  0 - 35 (U/L)  Alkaline Phosphatase 72  39 - 117 (U/L)    Total Bilirubin 0.6  0.3 - 1.2 (mg/dL)      GFR calc non Af Amer >90  >90 (mL/min)    GFR calc Af Amer >90  >90 (mL/min)   CK TOTAL AND CKMB     Status: Normal   11/25/11  9:49 PM   Total CK 62  7 - 177 (U/L)    CK, MB 1.8  0.3 - 4.0 (ng/mL)    Relative Index RELATIVE INDEX IS INVALID  0.0 - 2.5    TROPONIN I     Status: Normal   11/25/11  9:49 PM   Troponin I <0.30  <0.30 (ng/mL)   LIPID PANEL     Status: Normal   11/26/11  5:40 AM   Cholesterol 128  0 - 200 (mg/dL)    Triglycerides 82  <161 (mg/dL)    HDL 44  >09 (mg/dL)    Total CHOL/HDL Ratio 2.9      VLDL 16  0 - 40 (mg/dL)    LDL Cholesterol 68  0 - 99 (mg/dL)     Imaging: 04/11/5408 CHEST - 2 VIEW    IMPRESSION: Increase in interstitial septal lines suggests developing mild interstitial edema.  Bertha Stakes, M.D.   11/25/2011 CT HEAD WITHOUT CONTRAST IMPRESSION:  1.  No acute  pathology seen on CT. 2.  Chronic encephalomalacia at the right frontoparietal region, reflecting remote infarct. 3.  Chronic lacunar infarct in the left caudate. 4.  Scattered small vessel ischemic microangiopathy. JEFFREY CHANG, M.D.   11/26/2011 MRI HEAD WITHOUT CONTRAST MRA HEAD WITHOUT CONTRAST  Findings:  Chronic infarct in the right temporal parietal lobe with encephalomalacia.  Around the periphery of this chronic infarct there are small areas of restricted diffusion compatible with acute infarction which has extended from the chronic infarct.  No other areas of acute infarct are identified.  No hemorrhage is present.  Mild atrophy.  Chronic infarct in the caudate bilaterally and in the cerebral white matter bilaterally.  Brainstem is intact.  Small chronic infarcts in the cerebellum bilaterally.  Negative for mass lesion.  IMPRESSION: Chronic infarct right temporal parietal lobe.  Multiple small areas of acute infarct are seen around the periphery of the chronic infarct.   11/26/2011 MRA HEAD  Findings: Both vertebral arteries are patent to the basilar.  The basilar is tortuous and patent.   Both posterior cerebral arteries are patent.  Internal carotid artery is patent bilaterally.  There is atherosclerotic irregularity and mild stenosis in the cavernous carotid bilaterally.  Anterior and middle cerebral arteries are patent.  Decreased signal in the intracranial circulation due to motion.  There is irregularity of the anterior and middle cerebral artery suggesting atherosclerotic disease.  It is difficult to determine if there are  branch occlusions given the poor signal in the middle cerebral artery.  Image quality degraded by patient motion  IMPRESSION: Suboptimal study due to motion.  Intracranial atherosclerotic disease, likely  moderate in nature.   Assessment Ms. AL GAGEN is a 66 y.o. female with history of CVA and Af on therapeutic Coumadin AC who presents with L UE weakness and MRI consistent with multiple acute R temporal lobe infarcts surrounding previously infarcted region. Pt has history of hypotension and has BP on the lower side this am at 120/81.  Ideally, would like to raise BP to improve brain perfusion post-CVA. Has h/o CHF will need to be careful with risk of fluid  shift.  Will proceed with stroke work up.   1. Acute R CVA, multiple surrounding previously infarcted region, see above.  2. Hyperlipidemia- on simvastatin; LDL 68.  3. Tobacco use  4. Hypotension  5. CHF- CXR suggests new mild interstitial edema  Stroke risk factors:  AF, prior CVA, hyperlipidemia, ongoing tobacco use.   Recommendations:  1) Stroke likely from focal stenosis that lead to original stroke and new stroke occurred in the setting of hypoperfusion 2) Agree with Dr. Maxwell Caul plan - would try to keep MAP as close to 120 as possible until tomorrow evening 3) Will allow stroke team to decide regarding further imaging to evaluate for presence of a focal stenosis and whether a stent is warranted to prevent further re-infarction.   LOS: 1 day   Marya Fossa PA-C [facility] [phone] 11/26/2011  9:12 AM

## 2011-11-26 NOTE — Progress Notes (Signed)
  Echocardiogram 2D Echocardiogram has been performed.  Yvonne Collier [facility] 11/26/2011, 10:28 AM

## 2011-11-26 NOTE — Progress Notes (Signed)
Utilization review completed. Monica Becton 11/26/2011

## 2011-11-26 NOTE — Progress Notes (Signed)
Patient reports not taking Lasix as prescribed per home regiment, twice a day.  Reports only taking 80mg  of Lasix once a day and stated "When I take it late I am up all night and get no sleep so I haven't been taking it".  Spoke with patient regarding importance of medication and education provided.  Will continue to monitor.  Osvaldo Angst, RN

## 2011-11-26 NOTE — Progress Notes (Signed)
Subjective: Patient is without any new complaints.  Objective: Vital signs in last 24 hours: Temp:  [97.4 F (36.3 C)-98.3 F (36.8 C)] 97.7 F (36.5 C) (01/18 1300) Pulse Rate:  [57-134] 84  (01/18 1340) Resp:  [16-25] 18  (01/18 1300) BP: (105-147)/(67-120) 114/67 mmHg (01/18 1300) SpO2:  [92 %-99 %] 99 % (01/18 1340) FiO2 (%):  [2 %] 2 % (01/18 0500) Weight:  [72.6 kg (160 lb 0.9 oz)] 72.6 kg (160 lb 0.9 oz) (01/18 0100) Weight change:  General: Patient appears her stated age. Cardiovascular: Regular rate rhythm. Lungs: Clear to auscultation bilaterally. Abdomen: Positive bowel sounds. Extremities: Left upper extremity weakness strength about 3-4/5. Neuro: Slightly slurred speech. -Intake/Output from previous day: 01/17 0701 - 01/18 0700 In: 671.8 [P.O.:323; I.V.:348.8] Out: 200 [Urine:200]  Blood pressure 114/67, pulse 84, temperature 97.7 F (36.5 C), temperature source Oral, resp. rate 18, height 5\' 4"  (1.626 m), weight 72.6 kg (160 lb 0.9 oz), SpO2 99.00%.   Lab Results: Lab Results  Component Value Date   WBC 9.9 11/25/2011   HGB 16.0* 11/25/2011   HCT 47.0* 11/25/2011   MCV 90.4 11/25/2011   PLT 252 11/25/2011    BMET  Lab 11/25/11 2203 11/25/11 2149  NA 140 --  K 3.3* --  CL 100 --  CO2 -- 28  BUN 12 --  CREATININE 0.80 --  LABGLOM -- --  GLUCOSE 112* --  CALCIUM -- 9.2    Studies/Results: Dg Chest 2 View  11/26/2011  *RADIOLOGY REPORT*  Clinical Data: Stroke with weakness on the left, shortness of breath, cough and congestion  CHEST - 2 VIEW  Comparison: 11/25/2011  Findings: Cardiomegaly is stable. Coarse interstitial markings are identified compatible with some underlying chronic change and probable COPD.  Prominence of the fissures is noted and interval increase in interstitial septal lines are identified compatible with developing mild interstitial edema.  No overt congestive failure or pleural fluid is seen. Prominence of the apical vasculature would  correlate with some degree of venous hypertension. No focal infiltrates are seen.  Bony structures demonstrate mild degenerative change of the mid thoracic spine and are otherwise intact.  IMPRESSION: Increase in interstitial septal lines suggests developing mild interstitial edema.  Otherwise unchanged cardiopulmonary appearance  Original Report Authenticated By: Bertha Stakes, M.D.   Dg Chest 2 View  11/25/2011  *RADIOLOGY REPORT*  Clinical Data: Shortness of breath  CHEST - 2 VIEW  Comparison: CT chest dated 10/02/2005  Findings: Chronic interstitial markings.  No pleural effusion or pneumothorax.  Stable cardiomegaly.  Degenerative changes of the visualized thoracolumbar spine.  IMPRESSION: No evidence of acute cardiopulmonary disease.  Stable cardiomegaly with chronic interstitial markings.  Original Report Authenticated By: Charline Bills, M.D.   Ct Head Wo Contrast  11/25/2011  *RADIOLOGY REPORT*  Clinical Data: Code stroke; sudden onset of left-sided weakness.  CT HEAD WITHOUT CONTRAST  Technique:  Contiguous axial images were obtained from the base of the skull through the vertex without contrast.  Comparison: None.  Findings: There is no evidence of acute infarction, mass lesion, or intra- or extra-axial hemorrhage on CT.  There is chronic encephalomalacia at the right frontoparietal region, reflecting remote infarct.  In addition, there is a chronic lacunar infarct within the left caudate.  Mild cerebellar atrophy is noted.  Scattered periventricular and subcortical white matter change likely reflects small vessel ischemic microangiopathy.  The brainstem and fourth ventricle are within normal limits.  The third and lateral ventricles are unremarkable in appearance.  The cerebral hemispheres demonstrate grossly normal gray-white differentiation.  No mass effect or midline shift is seen.  There is no evidence of fracture; visualized osseous structures are unremarkable in appearance.  The  visualized portions of the orbits are within normal limits.  The paranasal sinuses and mastoid air cells are well-aerated.  No significant soft tissue abnormalities are seen.  IMPRESSION:  1.  No acute of free pathology seen on CT. 2.  Chronic encephalomalacia at the right frontoparietal region, reflecting remote infarct. 3.  Chronic lacunar infarct in the left caudate. 4.  Scattered small vessel ischemic microangiopathy.  Critical Value/emergent results were called by telephone at the time of interpretation on 11/25/2011  at 10:07 p.m.  to  Dr. Linwood Dibbles, who verbally acknowledged these results.  Original Report Authenticated By: Tonia Ghent, M.D.   Mr Brain Wo Contrast  11/26/2011  *RADIOLOGY REPORT*  Clinical Data:  Left arm weakness. Acute but ill defined cerebrovascular disease.  MRI HEAD WITHOUT CONTRAST MRA HEAD WITHOUT CONTRAST  Technique:  Multiplanar, multiecho pulse sequences of the brain and surrounding structures were obtained without intravenous contrast. Angiographic images of the head were obtained using MRA technique without contrast.  Comparison:  CT 11/25/2011  MRI HEAD  Findings:  Chronic infarct in the right temporal parietal lobe with encephalomalacia.  Around the periphery of this chronic infarct there are small areas of restricted diffusion compatible with acute infarction which has extended from the chronic infarct.  No other areas of acute infarct are identified.  No hemorrhage is present.  Mild atrophy.  Chronic infarct in the caudate bilaterally and in the cerebral white matter bilaterally.  Brainstem is intact.  Small chronic infarcts in the cerebellum bilaterally.  Negative for mass lesion.  IMPRESSION: Chronic infarct right temporal parietal lobe.  Multiple small areas of acute infarct are seen around the periphery of the chronic infarct.  MRA HEAD  Findings: Both vertebral arteries are patent to the basilar.  The basilar is tortuous and patent.  Both posterior cerebral arteries  are patent.  Internal carotid artery is patent bilaterally.  There is atherosclerotic irregularity and mild stenosis in the cavernous carotid bilaterally.  Anterior and middle cerebral arteries are patent.  Decreased signal in the intracranial circulation due to motion.  There is irregularity of the anterior and middle cerebral artery suggesting atherosclerotic disease.  It is difficult to determine if there are  branch occlusions given the poor signal in the middle cerebral artery.  Image quality degraded by patient motion  IMPRESSION: Suboptimal study due to motion.  Intracranial atherosclerotic disease, likely  moderate in nature.  Original Report Authenticated By: Camelia Phenes, M.D.   Mr Mra Head/brain Wo Cm  11/26/2011  *RADIOLOGY REPORT*  Clinical Data:  Left arm weakness. Acute but ill defined cerebrovascular disease.  MRI HEAD WITHOUT CONTRAST MRA HEAD WITHOUT CONTRAST  Technique:  Multiplanar, multiecho pulse sequences of the brain and surrounding structures were obtained without intravenous contrast. Angiographic images of the head were obtained using MRA technique without contrast.  Comparison:  CT 11/25/2011  MRI HEAD  Findings:  Chronic infarct in the right temporal parietal lobe with encephalomalacia.  Around the periphery of this chronic infarct there are small areas of restricted diffusion compatible with acute infarction which has extended from the chronic infarct.  No other areas of acute infarct are identified.  No hemorrhage is present.  Mild atrophy.  Chronic infarct in the caudate bilaterally and in the cerebral white matter bilaterally.  Brainstem is  intact.  Small chronic infarcts in the cerebellum bilaterally.  Negative for mass lesion.  IMPRESSION: Chronic infarct right temporal parietal lobe.  Multiple small areas of acute infarct are seen around the periphery of the chronic infarct.  MRA HEAD  Findings: Both vertebral arteries are patent to the basilar.  The basilar is tortuous and  patent.  Both posterior cerebral arteries are patent.  Internal carotid artery is patent bilaterally.  There is atherosclerotic irregularity and mild stenosis in the cavernous carotid bilaterally.  Anterior and middle cerebral arteries are patent.  Decreased signal in the intracranial circulation due to motion.  There is irregularity of the anterior and middle cerebral artery suggesting atherosclerotic disease.  It is difficult to determine if there are  branch occlusions given the poor signal in the middle cerebral artery.  Image quality degraded by patient motion  IMPRESSION: Suboptimal study due to motion.  Intracranial atherosclerotic disease, likely  moderate in nature.  Original Report Authenticated By: Camelia Phenes, M.D.    Medications:  Current Facility-Administered Medications  Medication Dose Route Frequency Provider Last Rate Last Dose  . 0.9 %  sodium chloride infusion   Intravenous Continuous Clarene Curran Jarrett-Davis, MD 20 mL/hr at 11/26/11 1222    . acetaminophen (TYLENOL) tablet 650 mg  650 mg Oral Q6H PRN Scarlette Calico C. [place], [place]   650 mg at 11/26/11 0035  . albuterol (PROVENTIL) (5 MG/ML) 0.5% nebulizer solution 2.5 mg  2.5 mg Nebulization Q2H PRN Lonia Blood, MD   2.5 mg at 11/26/11 0403  . aspirin suppository 300 mg  300 mg Rectal Daily Lonia Blood, MD       Or  . aspirin tablet 325 mg  325 mg Oral Daily Lonia Blood, MD   325 mg at 11/26/11 1127  . cholecalciferol (VITAMIN D) tablet 1,000 Units  1,000 Units Oral QHS Lonia Blood, MD      . diltiazem (CARDIZEM) injection 10 mg  10 mg Intravenous Once Verne Carrow, MD   10 mg at 11/25/11 2251  . furosemide (LASIX) tablet 80 mg  80 mg Oral Daily Dayshaun Whobrey Jarrett-Davis, MD      . ipratropium (ATROVENT) nebulizer solution 0.5 mg  0.5 mg Nebulization Q4H PRN Loletta Specter, RRT      . metoprolol succinate (TOPROL-XL) 24 hr tablet 100 mg  100 mg Oral Daily Lonia Blood, MD   100 mg at 11/26/11 1125  . ondansetron (ZOFRAN) injection 4 mg  4  mg Intravenous Q6H PRN Lonia Blood, MD      . potassium chloride SA (K-DUR,KLOR-CON) CR tablet 20 mEq  20 mEq Oral Daily Lonia Blood, MD   20 mEq at 11/26/11 1129  . senna-docusate (Senokot-S) tablet 1 tablet  1 tablet Oral QHS PRN Lonia Blood, MD      . simvastatin (ZOCOR) tablet 10 mg  10 mg Oral QHS Lonia Blood, MD      . sodium chloride 0.9 % bolus 1,000 mL  1,000 mL Intravenous Once Verne Carrow, MD   1,000 mL at 11/25/11 2247  . white petrolatum (VASELINE) gel           . zolpidem (AMBIEN) tablet 5 mg  5 mg Oral QHS PRN Almina Schul Jarrett-Davis, MD      . DISCONTD: furosemide (LASIX) tablet 80 mg  80 mg Oral BID Lonia Blood, MD   80 mg at 11/26/11 0851  . DISCONTD: ipratropium (ATROVENT) nebulizer solution 0.5 mg  0.5 mg Nebulization Q4H Lonia Blood, MD  0.5 mg at 11/26/11 1610    Assessment/Plan: Acute left-sided weakness Patient with acute stroke recurrent while on Coumadin. Not sure much else to do. PT OT consulted and neurology consult as well. We'll await recommendations.   TOBACCO ABUSE Counseled cessation   HYPERTENSION Blood pressure is stable.  Continue metoprolol.   Atrial fibrillation Continue metoprolol and Coumadin.     LOS: 1 day   Earlene Plater MD, [facility]  [phone] 11/26/2011, 5:52 PM

## 2011-11-26 NOTE — Telephone Encounter (Signed)
Called the patient informed of MD's instructions and she is in the hospital.

## 2011-11-26 NOTE — Telephone Encounter (Signed)
[facility] Triage Call Report Triage Record Num: [id number] Operator: Frederico Hamman Patient Name: Yvonne Collier Call Date & Time: 11/25/2011 8:16:15PM Patient Phone: [phone] PCP: Rene Paci Patient Gender: Female PCP Fax : [phone] Patient DOB: 07/22/1946 [facility] Name: [facility] Reason for Call: Caller: Erionna/Patient; PCP: Rene Paci; CB#: [phone]; Call Reason: Hand and Arm Feels Funny; Sx Onset: 11/25/2011; Sx Notes: ; Afebrile; Wt: ; Guideline Used: ; Disp:; Appt Scheduled?: Zaelynn calling on emergent line and states her left hand feels rubbery.Went to pick up drink in refrigerator and droped the drink. Speech sounds slurred. Per numbness protocol advised to hang upo and call 911 due to numbness and lack of cooordinationof arm within last 2 hrs. Care advice given. Protocol(s) Used: Numbness or Tingling Recommended Outcome per Protocol: Activate EMS 911 Reason for Outcome: New numbness/tingling associated with weakness or paralysis (unable to move) involving face, arm or leg, especially on same side of body, or loss of coordination (purposeful action) occurring now or within last 2 hours. Care Advice: ~ Protect the patient from falling or other harm. ~ Do not give the patient anything to eat or drink. ~ An adult should stay with the patient, preferably one trained in CPR. ~ IMMEDIATE ACTION Spinal Immobilization: - If head, neck, or spinal injury or disease is known, or suspected, tell the patient not to move. - Do not move the person unless there is an immediate threat to their life. - If moving becomes absolutely necessary, immobilize head, neck and spine. - Then move the patient's head, neck and body as a single unit to prevent or not worsen spinal cord injury. - If vomiting, immobilize, then roll patient to side with head, neck and body as a single unit. Do not turn or move head or neck. ~ 11/25/2011 8:25:34PM Page 1 of 1  [email or web address]

## 2011-11-27 LAB — PROTIME-INR: INR: 2.75 — ABNORMAL HIGH (ref 0.00–1.49)

## 2011-11-27 MED ORDER — WARFARIN SODIUM 5 MG PO TABS
5.0000 mg | ORAL_TABLET | Freq: Once | ORAL | Status: AC
  Administered 2011-11-27: 5 mg via ORAL
  Filled 2011-11-27: qty 1

## 2011-11-27 MED ORDER — ACETAMINOPHEN 325 MG PO TABS
650.0000 mg | ORAL_TABLET | Freq: Four times a day (QID) | ORAL | Status: DC | PRN
  Administered 2011-11-27: 650 mg via ORAL

## 2011-11-27 NOTE — Progress Notes (Signed)
Occupational Therapy Evaluation Patient Details Name: Yvonne Collier MRN: [id number] DOB: 04-20-1946 Today's Date: 11/27/2011  Problem List:  Patient Active Problem List  Diagnoses  . PLANTAR WART, RIGHT  . HYPERLIPIDEMIA  . TOBACCO ABUSE  . DEPRESSION  . HYPERTENSION  . Atrial fibrillation  . CONGESTIVE HEART FAILURE  . CVA WITH LEFT HEMIPARESIS  . ASTHMA  . CHRONIC OBSTRUCTIVE PULMONARY DISEASE  . BACK PAIN, THORACIC REGION  . POSTMENOPAUSAL OSTEOPOROSIS  . CARDIAC MURMUR  . CEREBROVASCULAR ACCIDENT, HX OF  . PULMONARY EMBOLISM, HX OF  . RHEUMATIC FEVER, HX OF  . CHRONIC OBSTRUCTIVE PULMONARY DISEASE, ACUTE EXACERBATION  . Vitamin B12 deficiency (dietary) anemia  . Acute left-sided weakness  . Left arm numbness    Past Medical History:  Past Medical History  Diagnosis Date  . Atrial fibrillation     chronic anticoag  . PULMONARY EMBOLISM, HX OF 2001  . ASTHMA   . CONGESTIVE HEART FAILURE   . CHRONIC OBSTRUCTIVE PULMONARY DISEASE     ongoing tobacco  . DEPRESSION   . CARDIAC MURMUR   . HYPERLIPIDEMIA   . HYPERTENSION     off meds since 2011 due to sx hypotension  . POSTMENOPAUSAL OSTEOPOROSIS     intol fosamax (noncomp), reclast started 12/2010  . TOBACCO ABUSE   . RHEUMATIC FEVER, HX OF   . Stroke 1995    residual left hemiparesis  . Shortness of breath on exertion   . Arthritis   . Chronic high back pain    Past Surgical History:  Past Surgical History  Procedure Date  . Gastric bypass 06/2004    done in [place]- lap Roux n-y  . Tonsillectomy     "when I was real young"  . Appendectomy 1970's    OT Assessment/Plan/Recommendation OT Assessment Clinical Impression Statement: Pt is now s/p multiple Rt small cerebral infarcts surrounding area of previous chronic infarct.  She reports her Lt side has improved and is not yet back to her baseline. Pt and brother provide care for father who is in w/c. Will benefit from acute OT to increase I with ADLs  and functional transfers in prep for safe d/c home at mod I level. OT Recommendation/Assessment: Patient will need skilled OT in the acute care venue OT Problem List: Decreased strength;Decreased activity tolerance;Impaired vision/perception;Decreased coordination;Decreased knowledge of use of DME or AE;Impaired sensation;Impaired UE functional use OT Therapy Diagnosis : Generalized weakness;Paresis OT Plan OT Frequency: Min 2X/week OT Treatment/Interventions: Self-care/ADL training;Therapeutic exercise;Neuromuscular education;DME and/or AE instruction;Therapeutic activities;Visual/perceptual remediation/compensation;Patient/family education OT Recommendation Follow Up Recommendations: Home health OT Equipment Recommended: None recommended by OT Individuals Consulted Consulted and Agree with Results and Recommendations: Patient OT Goals Acute Rehab OT Goals OT Goal Formulation: With patient Time For Goal Achievement: 7 days ADL Goals Pt Will Perform Grooming: with modified independence;Standing at sink ADL Goal: Grooming - Progress: Not met Pt Will Perform Upper Body Dressing: with modified independence;Sitting, chair;Sitting, bed ADL Goal: Upper Body Dressing - Progress: Not met Pt Will Perform Lower Body Dressing: with modified independence;Sit to stand from chair;Sit to stand from bed ADL Goal: Lower Body Dressing - Progress: Not met Pt Will Transfer to Toilet: with modified independence;Regular height toilet;with DME;3-in-1;Ambulation ADL Goal: Toilet Transfer - Progress: Not met Arm Goals Pt Will Complete Theraputty Exer: Independently;Left upper extremity;Min resistance putty;to increase strength Arm Goal: Theraputty Exercises - Progress: Not met Additional Arm Goal #1: Pt will incorporate L UE during ADL tasks in order to increase fine motor coordination during  functional activities. Arm Goal: Additional Goal #1 - Progress: Not met Miscellaneous OT Goals Miscellaneous OT Goal  #1: Pt will perform bed mobility with mod I in prep for EOB ADLs. OT Goal: Miscellaneous Goal #1 - Progress: Not met  OT Evaluation Precautions/Restrictions  Precautions Precautions: Fall Required Braces or Orthoses: No Restrictions Weight Bearing Restrictions: No Prior Functioning Home Living Lives With: Family (brother (works 7-3), father (requries assist, in w.c)) Receives Help From: Family (another brother is retired and may help dad in am) Type of Home: House Home Layout: One level Home Access: Stairs to enter Entrance Stairs-Rails: Right Entrance Stairs-Number of Steps: 3 Bathroom Shower/Tub: Counselling psychologist: Yes How Accessible: Accessible via wheelchair Home Adaptive Equipment: Bedside commode/3-in-1;Grab bars around toilet;Grab bars in shower;Hand-held shower hose;Straight cane Additional Comments: father uses 3n1 in tub as a shower seat (they share a bathroom); pt reports she provides stand-by assistance during her father's transfers in/out of w/c, he self-propels w/c, he needs assist with bathing (her brother does this) and dressing Prior Function Level of Independence: Independent with basic ADLs;Independent with homemaking with ambulation;Independent with gait Driving: Yes Vocation: On disability ADL ADL Grooming: Simulated;Set up Where Assessed - Grooming: Supine, head of bed up Upper Body Bathing: Simulated;Minimal assistance Upper Body Bathing Details (indicate cue type and reason): min assist to wash R side due L UE ataxic movements Where Assessed - Upper Body Bathing: Sitting, bed;Unsupported Upper Body Dressing: Simulated;Minimal assistance Upper Body Dressing Details (indicate cue type and reason): Min assist due to LUE ataxic movements Lower Body Dressing: Simulated;Minimal assistance Lower Body Dressing Details (indicate cue type and reason): Min assist to don socks due to limited use of L UE Where  Assessed - Lower Body Dressing: Sitting, bed Ambulation Related to ADLs: Ambulation not attempted at this time due to increased HR (133) during EOB sitting. Vision/Perception  Vision - Assessment Eye Alignment: Within Functional Limits Vision Assessment: Vision impaired - to be further tested in functional context Additional Comments: VPt reports occasionally experiencing double vision, but not all the time.  No other deficits noted upon initial assessment Cognition Cognition Arousal/Alertness: Awake/alert Overall Cognitive Status: Appears within functional limits for tasks assessed Orientation Level: Oriented X4 Sensation/Coordination Sensation Light Touch: Impaired by gross assessment (numbness in L UE and L LE) Proprioception: Impaired by gross assessment (L UE) Coordination Gross Motor Movements are Fluid and Coordinated: No (LUE) Fine Motor Movements are Fluid and Coordinated: No (LUE) Extremity Assessment RUE Assessment RUE Assessment: Within Functional Limits LUE Assessment LUE Assessment: Exceptions to WFL LUE AROM (degrees) LUE Overall AROM Comments: WFL, ataxic movement, increased time to move LUE through gravity LUE Strength LUE Overall Strength Comments: shoulder, wrist, and elbow grossly: 3/5, grip: 3+/5 Mobility  Bed Mobility Bed Mobility: Yes Rolling Right: 5: Supervision;With rail Rolling Right Details (indicate cue type and reason): RUE assist on rail Right Sidelying to Sit: 5: Supervision;With rails;HOB flat Sit to Supine: 5: Supervision;HOB flat Sit to Supine - Details (indicate cue type and reason): Pt with increased HR to 133 while sitting EOB Exercises  Pt given soft (tan) theraputty and exercise handout to be completed 2-3 x a day End of Session OT - End of Session Activity Tolerance: Patient limited by fatigue;Other (comment) (limited by HR sitting EOB) Patient left: in bed;with call bell in reach General Behavior During Session: [facility] for tasks  performed Cognition: [facility] for tasks performed   Cipriano Mile 11/27/2011, 5:38 PM  11/27/2011 Cipriano Mile OTR/L  Pager [phone] Office [phone]

## 2011-11-27 NOTE — Progress Notes (Signed)
Stroke Team Progress Note  HISTORY [name] is a 66 y.o. female with history of CVA and Af on therapeutic Coumadin AC who presents with L UE weakness and MRI consistent with multiple acute R temporal lobe infarcts surrounding previously infarcted region.  SUBJECTIVE Pt states speech may be improving some.  Has improved sensation in the left hand as well.  Just developed a pressure like frontal headache without visual disturbance or tinnitus.  Nurse reports pt to be impulsive. OBJECTIVE Most recent Vital Signs: Temp: 99 F  BP: 112/67 mmHg  Pulse Rate: 78  Respiratory Rate: 16 O2 Saturation: 94%  Intake/Output from previous day: 01/18 0701 - 01/19 0700 In: 1140 [P.O.:1140] Out: -   IV Fluid Intake:     . sodium chloride 20 mL/hr at 11/26/11 1222   Medications    . aspirin  300 mg Rectal Daily   Or  . aspirin  325 mg Oral Daily  . cholecalciferol  1,000 Units Oral QHS  . furosemide  80 mg Oral Daily  . metoprolol succinate  100 mg Oral Daily  . potassium chloride SA  20 mEq Oral Daily  . simvastatin  10 mg Oral QHS  . warfarin  5 mg Oral Once  . white petrolatum      . DISCONTD: furosemide  80 mg Oral BID  . DISCONTD: ipratropium  0.5 mg Nebulization Q4H  PRN albuterol, ipratropium, ondansetron (ZOFRAN) IV, senna-docusate, zolpidem  Diet:  Cardiac, thin liquids Activity: Up ad lib with fall precautions. DVT Prophylaxis: none, therapeutic INR  Mental Status: Alert, oriented, thought content appropriate.  Speech fluent with mild dysarthria persisting.   Able to follow 3 step commands without difficulty.  Cranial Nerves: Pupils are equally round and reactive to light. Visual fields full to confrontation. Extraocular movements are intact without nystagmus. Slight L facial droop. Hearing intact to bilateral finger rub. Tongue protrusion, uvula, palate midline. Shoulder shrug intact.  Motor: Strength 5/% R U/LE. L proximal UE 5/5. Wrist dorsiflexion -4/5, L  grip slightly weaker than R. mild L pronator drift present.   Reflexes:  Biceps  Triceps  Brachioradialis  Knee   Ankle  Right 2+  2+    2+   2+   2+  Left 2+   2+    2+   2+   2+  Plantars: down going on right, upgoing on the left.   Coordination: Normal finger to nose, heel to shin on R. Dysmetric on L. Rapid alternating movements impaired on the Left.  Sensation: slightly improved sensation to light touch on the left.  Studies: CBC    Component Value Date/Time   WBC 9.9 11/25/2011 2149   RBC 4.90 11/25/2011 2149   HGB 16.0* 11/25/2011 2203   HCT 47.0* 11/25/2011 2203   PLT 252 11/25/2011 2149   MCV 90.4 11/25/2011 2149   MCH 30.2 11/25/2011 2149   MCHC 33.4 11/25/2011 2149   RDW 13.5 11/25/2011 2149   LYMPHSABS 2.1 11/25/2011 2149   MONOABS 0.7 11/25/2011 2149   EOSABS 0.2 11/25/2011 2149   BASOSABS 0.0 11/25/2011 2149   CMP    Component Value Date/Time   NA 140 11/25/2011 2203   K 3.3* 11/25/2011 2203   CL 100 11/25/2011 2203   CO2 28 11/25/2011 2149   GLUCOSE 112* 11/25/2011 2203   BUN 12 11/25/2011 2203   CREATININE 0.80 11/25/2011 2203   CALCIUM 9.2 11/25/2011 2149   PROT 7.5 11/25/2011 2149   ALBUMIN 4.0 11/25/2011  2149   AST 24 11/25/2011 2149   ALT 15 11/25/2011 2149   ALKPHOS 72 11/25/2011 2149   BILITOT 0.6 11/25/2011 2149   GFRNONAA >90 11/25/2011 2149   GFRAA >90 11/25/2011 2149   COAGS Lab Results  Component Value Date   INR 2.75* 11/27/2011   INR 2.06* 11/25/2011   INR 2.7 10/29/2011   Lipid Panel    Component Value Date/Time   CHOL 128 11/26/2011 0540   TRIG 82 11/26/2011 0540   HDL 44 11/26/2011 0540   CHOLHDL 2.9 11/26/2011 0540   VLDL 16 11/26/2011 0540   LDLCALC 68 11/26/2011 0540   HgbA1C  Lab Results  Component Value Date   HGBA1C 5.8* 11/26/2011    11/25/2011 CT HEAD WITHOUT CONTRAST   Findings: There is no evidence of acute infarction, mass lesion, or intra- or extra-axial hemorrhage on CT.  There is chronic encephalomalacia at the right frontoparietal  region, reflecting remote infarct.  In addition, there is a chronic lacunar infarct within the left caudate.  Mild cerebellar atrophy is noted.  Scattered periventricular and subcortical white matter change likely reflects small vessel ischemic microangiopathy.  The brainstem and fourth ventricle are within normal limits.  The third and lateral ventricles are unremarkable in appearance.  The cerebral hemispheres demonstrate grossly normal gray-white differentiation.  No mass effect or midline shift is seen.  There is no evidence of fracture; visualized osseous structures are unremarkable in appearance.  The visualized portions of the orbits are within normal limits.  The paranasal sinuses and mastoid air cells are well-aerated.  No significant soft tissue abnormalities are seen.  IMPRESSION:  1.  No acute of free pathology seen on CT. 2.  Chronic encephalomalacia at the right frontoparietal region, reflecting remote infarct. 3.  Chronic lacunar infarct in the left caudate. 4.  Scattered small vessel ischemic microangiopathy.  JEFFREY CHANG, M.D.   11/26/2011    MRI HEAD WITHOUT CONTRAST  Findings:  Chronic infarct in the right temporal parietal lobe with encephalomalacia.  Around the periphery of this chronic infarct there are small areas of restricted diffusion compatible with acute infarction which has extended from the chronic infarct.  No other areas of acute infarct are identified.  No hemorrhage is present.  Mild atrophy.  Chronic infarct in the caudate bilaterally and in the cerebral white matter bilaterally.  Brainstem is intact.  Small chronic infarcts in the cerebellum bilaterally.  Negative for mass lesion.  IMPRESSION: Chronic infarct right temporal parietal lobe.  Multiple small areas of acute infarct are seen around the periphery of the chronic infarct.   11/26/2011  MRA HEAD  Findings: Both vertebral arteries are patent to the basilar.  The basilar is tortuous and patent.  Both posterior cerebral  arteries are patent.  Internal carotid artery is patent bilaterally.  There is atherosclerotic irregularity and mild stenosis in the cavernous carotid bilaterally.  Anterior and middle cerebral arteries are patent.  Decreased signal in the intracranial circulation due to motion.  There is irregularity of the anterior and middle cerebral artery suggesting atherosclerotic disease.  It is difficult to determine if there are  branch occlusions given the poor signal in the middle cerebral artery.  Image quality degraded by patient motion  IMPRESSION: Suboptimal study due to motion.  Intracranial atherosclerotic disease, likely  moderate in nature. Camelia Phenes, M.D.   11/26/2011 2D Echocardiogram  Left ventricle: The cavity size was normal. Wall thickness normal. Systolic function      was normal. The  estimated ejection fraction  50% to 55%.           Mitral valve: The findings are consistent with moderate stenosis. Moderate       regurgitation. Valve area by pressure half-time: 1.76cm^2.           Left atrium: The atrium was severely dilated.           Right atrium: The atrium was mildly dilated.           Pulmonary arteries: Systolic pressure was moderately increased. PA peak       pressure: 47mm Hg (S).  11/26/2011 Carotid Doppler - Bilateral carotid artery duplex completed. Preliminary report is no evidence of      significant ICA stenosis.  11/25/2011 EKG: AFib.  Therapies: PT: rec HHPT due to decreased strength;decreased activity tolerance;decreased balance;decreased mobility;decreased knowledge of use of DME;   ASSESSMENT [name] is a 66 y.o. female with a   1.  Multiple small areas of acute infarct are seen around the periphery of the chronic infarct R tempoparietal   Lobe- occurred while on therapeutic Coumadin. MRA was degraded by motion artifact.   2. Dysarthria and L UE weakness due to #1  3. AFib- rate controlled, INR 2.75.  Severe LAD.  4. Hyperlipidemia- LDL at goal-  68, on zocor.  5. Headache-   On warfarin for secondary stroke prevention.   Stroke risk factors:  atrial fibrillation  Hospital day # 2  TREATMENT/PLAN 1.Continue warfarin for secondary stroke prevention. 2.Tylenol 650 mg Q 6 hours prn HA. 3.Transcranial doppler to assess distal vessels.  Delice Bison Jernejcic PA-C [facility] [phone]  I have participated in evaluation, examination, medical decision making and plan of care of this patient. Delia Heady, MD

## 2011-11-27 NOTE — Progress Notes (Signed)
Physical Therapy Treatment Patient Details Name: Yvonne Collier MRN: [id number] DOB: [birth year] Today's Date: 11/27/2011  PT Assessment/Plan  PT - Assessment/Plan Comments on Treatment Session: Pt made great progress today.  Feels she is getting stronger but still has some numbness in L hand.   PT Frequency: Min 4X/week Follow Up Recommendations: Home health PT Equipment Recommended: None recommended by PT PT Goals  Acute Rehab PT Goals PT Goal: Sit to Stand - Progress: Met PT Goal: Stand to Sit - Progress: Met PT Goal: Ambulate - Progress: Progressing toward goal PT Goal: Up/Down Stairs - Progress: Progressing toward goal  PT Treatment Precautions/Restrictions  Precautions Precautions: Fall Required Braces or Orthoses: No Restrictions Weight Bearing Restrictions: No Mobility (including Balance) Bed Mobility Bed Mobility: No Transfers Sit to Stand: 6: Modified independent (Device/Increase time);With upper extremity assist;From chair/3-in-1;Other (comment) (mat table in gym. ) Sit to Stand Details (indicate cue type and reason): Cues for increased awareness of LUE & use of LUE.  Performed 5x's.   Stand to Sit: 6: Modified independent (Device/Increase time);To chair/3-in-1;Other (comment);With upper extremity assist;With armrests (mat table in gym. ) Stand to Sit Details: Cues for increased awareness/use of LUE.   Ambulation/Gait Ambulation/Gait Assistance: Other (comment) (Min Guard (A)) Ambulation/Gait Assistance Details (indicate cue type and reason): Guarding for safety.  Pt began to c/o double vision with increased distance- Required standing rest break while leaning against wall for support.  Pt with mild lateral sway.  No LOB noted with vertical head turns, directional changes, gait velocity changes.  Pt keeps LUE flexed at elbow & adducted to body.   Ambulation Distance (Feet): 150 Feet Assistive device: None Gait Pattern: Step-through pattern;Decreased stride  length;Decreased stance time - left Stairs: Yes Stairs Assistance: 5: Supervision Stairs Assistance Details (indicate cue type and reason): Cues for sequencing & technique.  Stair Management Technique: One rail Right;Step to pattern;Forwards Number of Stairs: 2  Naval architect Mobility: No    Exercise    End of Session PT - End of Session Equipment Utilized During Treatment: Gait belt Activity Tolerance: Patient tolerated treatment well Patient left: in chair;with call bell in reach;Other (comment) (chair alarm) General Behavior During Session: [facility] for tasks performed Cognition: [facility] for tasks performed  Lara Mulch 11/27/2011, 12:47 PM [phone]

## 2011-11-27 NOTE — Progress Notes (Signed)
Subjective: No current events/complaints.  Objective: Vital signs in last 24 hours: Temp:  [96.5 F (35.8 C)-99 F (37.2 C)] 96.5 F (35.8 C) (01/19 1400) Pulse Rate:  [67-94] 76  (01/19 1400) Resp:  [16-20] 18  (01/19 1400) BP: (103-139)/(66-75) 127/71 mmHg (01/19 1400) SpO2:  [92 %-97 %] 94 % (01/19 1400) Weight change:  Last BM Date: 11/25/11  Intake/Output from previous day: 01/18 0701 - 01/19 0700 In: 1140 [P.O.:1140] Out: -  Total I/O In: 360 [P.O.:360] Out: -    Physical Exam: General: Alert, awake, oriented x3, in no acute distress. HEENT: No bruits, no goiter. Heart: Regular rate and rhythm, without murmurs, rubs, gallops. Lungs: Clear to auscultation bilaterally. Abdomen: Soft, nontender, nondistended, positive bowel sounds. Extremities: No clubbing cyanosis or edema with positive pedal pulses. Neuro: Grossly intact, nonfocal.    Lab Results: Basic Metabolic Panel:  Basename 11/25/11 2203 11/25/11 2149  NA 140 138  K 3.3* 3.5  CL 100 97  CO2 -- 28  GLUCOSE 112* 115*  BUN 12 13  CREATININE 0.80 0.69  CALCIUM -- 9.2  MG -- --  PHOS -- --   Liver Function Tests:  [facility] 11/25/11 2149  AST 24  ALT 15  ALKPHOS 72  BILITOT 0.6  PROT 7.5  ALBUMIN 4.0   CBC:  Basename 11/25/11 2203 11/25/11 2149  WBC -- 9.9  NEUTROABS -- 6.9  HGB 16.0* 14.8  HCT 47.0* 44.3  MCV -- 90.4  PLT -- 252   Cardiac Enzymes:  Basename 11/25/11 2149  CKTOTAL 62  CKMB 1.8  CKMBINDEX --  TROPONINI <0.30   Hemoglobin A1C:  Basename 11/26/11 0540  HGBA1C 5.8*   Fasting Lipid Panel:  Basename 11/26/11 0540  CHOL 128  HDL 44  LDLCALC 68  TRIG 82  CHOLHDL 2.9  LDLDIRECT --   Coagulation:  Basename 11/27/11 0500 11/25/11 2149  LABPROT 29.5* 23.6*  INR 2.75* 2.06*    Studies/Results: Dg Chest 2 View  11/26/2011  *RADIOLOGY REPORT*  Clinical Data: Stroke with weakness on the left, shortness of breath, cough and congestion  CHEST - 2 VIEW   Comparison: 11/25/2011  Findings: Cardiomegaly is stable. Coarse interstitial markings are identified compatible with some underlying chronic change and probable COPD.  Prominence of the fissures is noted and interval increase in interstitial septal lines are identified compatible with developing mild interstitial edema.  No overt congestive failure or pleural fluid is seen. Prominence of the apical vasculature would correlate with some degree of venous hypertension. No focal infiltrates are seen.  Bony structures demonstrate mild degenerative change of the mid thoracic spine and are otherwise intact.  IMPRESSION: Increase in interstitial septal lines suggests developing mild interstitial edema.  Otherwise unchanged cardiopulmonary appearance  Original Report Authenticated By: Bertha Stakes, M.D.   Dg Chest 2 View  11/25/2011  *RADIOLOGY REPORT*  Clinical Data: Shortness of breath  CHEST - 2 VIEW  Comparison: CT chest dated 10/02/2005  Findings: Chronic interstitial markings.  No pleural effusion or pneumothorax.  Stable cardiomegaly.  Degenerative changes of the visualized thoracolumbar spine.  IMPRESSION: No evidence of acute cardiopulmonary disease.  Stable cardiomegaly with chronic interstitial markings.  Original Report Authenticated By: Charline Bills, M.D.   Ct Head Wo Contrast  11/25/2011  *RADIOLOGY REPORT*  Clinical Data: Code stroke; sudden onset of left-sided weakness.  CT HEAD WITHOUT CONTRAST  Technique:  Contiguous axial images were obtained from the base of the skull through the vertex without contrast.  Comparison: None.  Findings: There is no evidence of acute infarction, mass lesion, or intra- or extra-axial hemorrhage on CT.  There is chronic encephalomalacia at the right frontoparietal region, reflecting remote infarct.  In addition, there is a chronic lacunar infarct within the left caudate.  Mild cerebellar atrophy is noted.  Scattered periventricular and subcortical white matter  change likely reflects small vessel ischemic microangiopathy.  The brainstem and fourth ventricle are within normal limits.  The third and lateral ventricles are unremarkable in appearance.  The cerebral hemispheres demonstrate grossly normal gray-white differentiation.  No mass effect or midline shift is seen.  There is no evidence of fracture; visualized osseous structures are unremarkable in appearance.  The visualized portions of the orbits are within normal limits.  The paranasal sinuses and mastoid air cells are well-aerated.  No significant soft tissue abnormalities are seen.  IMPRESSION:  1.  No acute of free pathology seen on CT. 2.  Chronic encephalomalacia at the right frontoparietal region, reflecting remote infarct. 3.  Chronic lacunar infarct in the left caudate. 4.  Scattered small vessel ischemic microangiopathy.  Critical Value/emergent results were called by telephone at the time of interpretation on 11/25/2011  at 10:07 p.m.  to  Dr. Linwood Dibbles, who verbally acknowledged these results.  Original Report Authenticated By: Tonia Ghent, M.D.   Mr Brain Wo Contrast  11/26/2011  *RADIOLOGY REPORT*  Clinical Data:  Left arm weakness. Acute but ill defined cerebrovascular disease.  MRI HEAD WITHOUT CONTRAST MRA HEAD WITHOUT CONTRAST  Technique:  Multiplanar, multiecho pulse sequences of the brain and surrounding structures were obtained without intravenous contrast. Angiographic images of the head were obtained using MRA technique without contrast.  Comparison:  CT 11/25/2011  MRI HEAD  Findings:  Chronic infarct in the right temporal parietal lobe with encephalomalacia.  Around the periphery of this chronic infarct there are small areas of restricted diffusion compatible with acute infarction which has extended from the chronic infarct.  No other areas of acute infarct are identified.  No hemorrhage is present.  Mild atrophy.  Chronic infarct in the caudate bilaterally and in the cerebral white  matter bilaterally.  Brainstem is intact.  Small chronic infarcts in the cerebellum bilaterally.  Negative for mass lesion.  IMPRESSION: Chronic infarct right temporal parietal lobe.  Multiple small areas of acute infarct are seen around the periphery of the chronic infarct.  MRA HEAD  Findings: Both vertebral arteries are patent to the basilar.  The basilar is tortuous and patent.  Both posterior cerebral arteries are patent.  Internal carotid artery is patent bilaterally.  There is atherosclerotic irregularity and mild stenosis in the cavernous carotid bilaterally.  Anterior and middle cerebral arteries are patent.  Decreased signal in the intracranial circulation due to motion.  There is irregularity of the anterior and middle cerebral artery suggesting atherosclerotic disease.  It is difficult to determine if there are  branch occlusions given the poor signal in the middle cerebral artery.  Image quality degraded by patient motion  IMPRESSION: Suboptimal study due to motion.  Intracranial atherosclerotic disease, likely  moderate in nature.  Original Report Authenticated By: Camelia Phenes, M.D.   Mr Mra Head/brain Wo Cm  11/26/2011  *RADIOLOGY REPORT*  Clinical Data:  Left arm weakness. Acute but ill defined cerebrovascular disease.  MRI HEAD WITHOUT CONTRAST MRA HEAD WITHOUT CONTRAST  Technique:  Multiplanar, multiecho pulse sequences of the brain and surrounding structures were obtained without intravenous contrast. Angiographic images of the head were obtained using MRA  technique without contrast.  Comparison:  CT 11/25/2011  MRI HEAD  Findings:  Chronic infarct in the right temporal parietal lobe with encephalomalacia.  Around the periphery of this chronic infarct there are small areas of restricted diffusion compatible with acute infarction which has extended from the chronic infarct.  No other areas of acute infarct are identified.  No hemorrhage is present.  Mild atrophy.  Chronic infarct in the  caudate bilaterally and in the cerebral white matter bilaterally.  Brainstem is intact.  Small chronic infarcts in the cerebellum bilaterally.  Negative for mass lesion.  IMPRESSION: Chronic infarct right temporal parietal lobe.  Multiple small areas of acute infarct are seen around the periphery of the chronic infarct.  MRA HEAD  Findings: Both vertebral arteries are patent to the basilar.  The basilar is tortuous and patent.  Both posterior cerebral arteries are patent.  Internal carotid artery is patent bilaterally.  There is atherosclerotic irregularity and mild stenosis in the cavernous carotid bilaterally.  Anterior and middle cerebral arteries are patent.  Decreased signal in the intracranial circulation due to motion.  There is irregularity of the anterior and middle cerebral artery suggesting atherosclerotic disease.  It is difficult to determine if there are  branch occlusions given the poor signal in the middle cerebral artery.  Image quality degraded by patient motion  IMPRESSION: Suboptimal study due to motion.  Intracranial atherosclerotic disease, likely  moderate in nature.  Original Report Authenticated By: Camelia Phenes, M.D.    Medications: Scheduled Meds:   . aspirin  300 mg Rectal Daily   Or  . aspirin  325 mg Oral Daily  . cholecalciferol  1,000 Units Oral QHS  . furosemide  80 mg Oral Daily  . metoprolol succinate  100 mg Oral Daily  . potassium chloride SA  20 mEq Oral Daily  . simvastatin  10 mg Oral QHS  . warfarin  5 mg Oral Once  . warfarin  5 mg Oral ONCE-1800  . white petrolatum       Continuous Infusions:   . sodium chloride 20 mL/hr at 11/26/11 1222   PRN Meds:.acetaminophen, albuterol, ipratropium, ondansetron (ZOFRAN) IV, senna-docusate, zolpidem  Assessment/Plan:  Principal Problem:  *Acute left-sided weakness Active Problems:  TOBACCO ABUSE  HYPERTENSION  Atrial fibrillation  Left arm numbness   #1 acute left-sided weakness: This is secondary to  multiple acute right temporal lobe infarct surrounding previously infarcted region. Patient has a known history of atrial fibrillation and is currently therapeutic on Coumadin. Will continue Coumadin for secondary stroke prevention. PT has recommended home health physical therapy. Patient has nobody to stay with her tomorrow and would prefer discharge on Monday if possible. We'll discuss with her daughter in the morning. Aggressive risk factor modification is key.   LOS: 2 days   [facility] Pager: [phone] 11/27/2011, 4:46 PM

## 2011-11-27 NOTE — Progress Notes (Signed)
ANTICOAGULATION CONSULT NOTE - Follow Up Consult  Pharmacy Consult for Warfarin Indication: atrial fibrillation and history of CVA  No Known Allergies  Patient Measurements: Height: 5\' 4"  (162.6 cm) Weight: 160 lb 0.9 oz (72.6 kg) IBW/kg (Calculated) : 54.7   Vital Signs: Temp: 97.2 F (36.2 C) (01/19 1000) Temp src: Oral (01/19 1000) BP: 139/74 mmHg (01/19 1000) Pulse Rate: 67  (01/19 1000)  Labs:  Basename 11/27/11 0500 11/25/11 2203 11/25/11 2149  HGB -- 16.0* 14.8  HCT -- 47.0* 44.3  PLT -- -- 252  APTT -- -- 44*  LABPROT 29.5* -- 23.6*  INR 2.75* -- 2.06*  HEPARINUNFRC -- -- --  CREATININE -- 0.80 0.69  CKTOTAL -- -- 62  CKMB -- -- 1.8  TROPONINI -- -- <0.30   Estimated Creatinine Clearance: 69.4 ml/min (by C-G formula based on Cr of 0.8).  MRI= multiple acute R temporal infarcts.  Medications:  Scheduled:    . aspirin  300 mg Rectal Daily   Or  . aspirin  325 mg Oral Daily  . cholecalciferol  1,000 Units Oral QHS  . furosemide  80 mg Oral Daily  . metoprolol succinate  100 mg Oral Daily  . potassium chloride SA  20 mEq Oral Daily  . simvastatin  10 mg Oral QHS  . warfarin  5 mg Oral Once  . white petrolatum      . DISCONTD: furosemide  80 mg Oral BID    Assessment: 66 yo F on Coumadin PTA for afib and hx of CVA admitted with acute stoke symptoms. CT scan negative for bleed. Acute infarcts seen on MRI-was therapeutic on Coumadin at the time of infarcts. To continue Coumadin. Home regimen was 5mg  po daily except 2.5mg  on Wed and Sun. INR today is 2.75. Dose charted on 1/18.   Goal of Therapy:  INR 2-3   Plan:  1. Continue Coumadin 5mg  po x1 tonight. 2. Monitor PT/INR daily- if remains stable will reschedule home dosing.   Yvonne Collier 11/27/2011,12:44 PM

## 2011-11-28 DIAGNOSIS — I639 Cerebral infarction, unspecified: Secondary | ICD-10-CM

## 2011-11-28 MED ORDER — ASPIRIN 325 MG PO TABS: 325.0000 mg | ORAL_TABLET | Freq: Every day | ORAL | Status: DC

## 2011-11-28 NOTE — Progress Notes (Signed)
Clinical Social Work-CSW completed psychosocial assessment-please find in shadow chart-CSW provided POA/Living Will paperwork-reviewed briefly-Pt requested time to discuss with family. CSW offered options for community notarization if pt desired. No further needs at this time. Jodean Lima, [phone]

## 2011-11-28 NOTE — Progress Notes (Signed)
Stroke Team Progress Note  HISTORY Ms. IMAJEAN MCDERMID is a 66 y.o. female with history of CVA and Af on therapeutic Coumadin AC who presents with L UE weakness and MRI consistent with multiple acute R temporal lobe infarcts surrounding previously infarcted region.  SUBJECTIVE Her headache is improved this am.    OBJECTIVE Most recent Vital Signs: Temp: 98.2 F  Temp src: Oral  BP: 133/76 mmHg (01/20 0600) Pulse Rate: 71  (01/20 0600) Respiratory Rate: 19 O2 Saturation: 96%  Intake/Output from previous day: 01/19 0701 - 01/20 0700 In: 360 [P.O.:360] Out: -   IV Fluid Intake:     . sodium chloride 20 mL/hr at 11/26/11 1222   Medications    . aspirin  300 mg Rectal Daily   Or  . aspirin  325 mg Oral Daily  . cholecalciferol  1,000 Units Oral QHS  . furosemide  80 mg Oral Daily  . metoprolol succinate  100 mg Oral Daily  . potassium chloride SA  20 mEq Oral Daily  . simvastatin  10 mg Oral QHS  . warfarin  5 mg Oral ONCE-1800  PRN acetaminophen, albuterol, ipratropium, ondansetron (ZOFRAN) IV, senna-docusate, zolpidem  Diet:  Cardiac , thin liquids Activity:   Up with assistance, fall precautions DVT Prophylaxis: none, therapeutic INR  Mental Status:  Alert, oriented, thought content appropriate. Speech fluent with mild dysarthria persisting. Able to follow 3 step commands without difficulty.  Cranial Nerves:  Pupils are equally round and reactive to light. Visual fields full to confrontation. Extraocular movements are intact without nystagmus. Slight L facial droop, improved from 11/27/11 . Hearing intact to bilateral finger rub. Tongue protrusion, uvula, palate midline. Shoulder shrug intact.  Motor: Strength 5/% R U/LE. L proximal UE 5/5. Wrist dorsiflexion -4/5, L grip slightly weaker than R. mild L pronator drift present.   Reflexes:  Biceps  Triceps  Brachioradialis  Knee  Ankle  Right 2+  2+    2+   2+  2+  Left 2+  2+    2+   2+  2+   Plantars: down going  on right, upgoing on the left.   Coordination: Normal finger to nose, heel to shin on R. Dysmetric on L. Rapid alternating movements impaired on the Left.   Sensation: slightly improved sensation to light touch on the left.  Studies: COAGS Lab Results  Component Value Date   INR 3.57* 11/28/2011   INR 2.75* 11/27/2011   INR 2.06* 11/25/2011   Lipid Panel    Component Value Date/Time   CHOL 128 11/26/2011 0540   TRIG 82 11/26/2011 0540   HDL 44 11/26/2011 0540   CHOLHDL 2.9 11/26/2011 0540   VLDL 16 11/26/2011 0540   LDLCALC 68 11/26/2011 0540   HgbA1C  Lab Results  Component Value Date   HGBA1C 5.8* 11/26/2011    IMAGING:  11/25/2011 CT HEAD WITHOUT CONTRAST Findings: There is no evidence of acute infarction, mass lesion, or intra- or extra-axial hemorrhage on CT. There is chronic encephalomalacia at the right frontoparietal region, reflecting remote infarct. In addition, there is a chronic lacunar infarct within the left caudate. Mild cerebellar atrophy is noted. Scattered periventricular and subcortical white matter change likely reflects small vessel ischemic microangiopathy. The brainstem and fourth ventricle are within normal limits. The third and lateral ventricles are unremarkable in appearance. The cerebral hemispheres demonstrate grossly normal gray-white differentiation. No mass effect or midline shift is seen. There is no evidence of fracture; visualized osseous structures are  unremarkable in appearance. The visualized portions of the orbits are within normal limits. The paranasal sinuses and mastoid air cells are well-aerated. No significant soft tissue abnormalities are seen. IMPRESSION: 1. No acute of free pathology seen on CT. 2. Chronic encephalomalacia at the right frontoparietal region, reflecting remote infarct. 3. Chronic lacunar infarct in the left caudate. 4. Scattered small vessel ischemic microangiopathy. JEFFREY CHANG, M.D.   11/26/2011 MRI HEAD WITHOUT CONTRAST  Findings: Chronic infarct in the right temporal parietal lobe with encephalomalacia. Around the periphery of this chronic infarct there are small areas of restricted diffusion compatible with acute infarction which has extended from the chronic infarct. No other areas of acute infarct are identified. No hemorrhage is present. Mild atrophy. Chronic infarct in the caudate bilaterally and in the cerebral white matter bilaterally. Brainstem is intact. Small chronic infarcts in the cerebellum bilaterally. Negative for mass lesion. IMPRESSION: Chronic infarct right temporal parietal lobe. Multiple small areas of acute infarct are seen around the periphery of the chronic infarct.   11/26/2011 MRA HEAD Findings: Both vertebral arteries are patent to the basilar. The basilar is tortuous and patent. Both posterior cerebral arteries are patent. Internal carotid artery is patent bilaterally. There is atherosclerotic irregularity and mild stenosis in the cavernous carotid bilaterally. Anterior and middle cerebral arteries are patent. Decreased signal in the intracranial circulation due to motion. There is irregularity of the anterior and middle cerebral artery suggesting atherosclerotic disease. It is difficult to determine if there are branch occlusions given the poor signal in the middle cerebral artery. Image quality degraded by patient motion IMPRESSION: Suboptimal study due to motion. Intracranial atherosclerotic disease, likely moderate in nature. Camelia Phenes, M.D.   11/26/2011 2D Echocardiogram Left ventricle: The cavity size was normal. Wall thickness normal. Systolic function was normal. The estimated ejection fraction 50% to 55%. Mitral valve: The findings are consistent with moderate stenosis. Moderate regurgitation. Valve area by pressure half-time: 1.76cm^2. Left atrium: The atrium was severely dilated. Right atrium: The atrium was mildly dilated. Pulmonary arteries: Systolic pressure was moderately  increased. PA peak pressure: 47mm Hg (S).  11/26/2011 Carotid Doppler - Bilateral carotid artery duplex completed. Preliminary report is no evidence of significant ICA stenosis.   11/25/2011 EKG: AFib.   Therapies:  PT: rec HHPT due to decreased strength;decreased activity tolerance;decreased balance;decreased mobility;decreased knowledge of use of DME; OT: rec HHOT to increase I with ADLs and functional transfers in prep for safe d/c home at mod I level  ASSESSMENT Ms. ALANNI VADER is a 66 y.o. female with   1. Multiple small areas of acute infarct are seen around the periphery of the chronic infarct R tempoparietal   Lobe- occurred while on therapeutic Coumadin. MRA was degraded by motion artifact.   2. Dysarthria and L UE weakness due to #1   3. AFib- rate controlled, INR 3.57- pharmacy managing; goal 2-3  4. Hyperlipidemia- LDL at goal- 68, on zocor.   5. Headache- improved  6. Tobacco use- smoking cessation consult has been ordered.    On warfarin for secondary stroke prevention.   Stroke risk factors: atrial fibrillation, hyperlipidemia, tobacco use  Hospital day # 3  TREATMENT/PLAN 1.Continue warfarin for secondary stroke prevention- pharmacy to re-dose due to elevated INR.  2.Transcranial doppler to assess distal vessels.  3. Discharge planning- HHOT, PT. Likely DC home today.  Marya Fossa PA-C  [facility]  [phone]   Dr. Pearlean Brownie  has participated in evaluation, examination, medical decision making and plan of care  of this patient.

## 2011-11-28 NOTE — Progress Notes (Signed)
Patient discharged home with instructions about follow up appointments and medications.  Education provided regarding stroke risk factors, A-fib, and coumadin therapy.  Patient has not questions at this time.  Patient left unit in wheelchair with personal belongings in stable condition.  Osvaldo Angst, RN----------------------

## 2011-11-28 NOTE — Discharge Summary (Signed)
Physician Discharge Summary  Patient ID: Yvonne Collier MRN: [id number] DOB/AGE: [birth year] 66 y.o.  Admit date: 11/25/2011 Discharge date: 11/28/2011  Primary Care Physician:  Rene Paci, MD, MD   Discharge Diagnoses:    Principal Problem:  *Acute left-sided weakness Active Problems:  TOBACCO ABUSE  HYPERTENSION  Atrial fibrillation  Left arm numbness Multiple Embolic Right CVA   Current Discharge Medication List    START taking these medications   Details  aspirin 325 MG tablet Take 1 tablet (325 mg total) by mouth daily. Qty: 30 tablet, Refills: 11      CONTINUE these medications which have NOT CHANGED   Details  cholecalciferol (VITAMIN D) 1000 UNITS tablet Take 1,000 Units by mouth at bedtime.    furosemide (LASIX) 80 MG tablet Take 80 mg by mouth 2 (two) times daily.     metoprolol succinate (TOPROL-XL) 100 MG 24 hr tablet Take 100 mg by mouth daily.     potassium chloride SA (K-DUR,KLOR-CON) 20 MEQ tablet Take 20 mEq by mouth daily.    simvastatin (ZOCOR) 10 MG tablet Take 10 mg by mouth at bedtime.    warfarin (COUMADIN) 5 MG tablet Take 2.5-5 mg by mouth as directed. Take 5mg  on Mon, Tues,Thur, Sat & on Wed & Sun take 2.5mg          Disposition and Follow-up:  Patient will be discharged home today in stable and improved condition. She is instructed to followup with her primary care physician in 2-3 weeks.  Consults:  neurology Dr. Lyman Speller   Significant Diagnostic Studies:  Dg Chest 2 View  11/26/2011  *RADIOLOGY REPORT*  Clinical Data: Stroke with weakness on the left, shortness of breath, cough and congestion  CHEST - 2 VIEW  Comparison: 11/25/2011  Findings: Cardiomegaly is stable. Coarse interstitial markings are identified compatible with some underlying chronic change and probable COPD.  Prominence of the fissures is noted and interval increase in interstitial septal lines are identified compatible with developing mild interstitial edema.   No overt congestive failure or pleural fluid is seen. Prominence of the apical vasculature would correlate with some degree of venous hypertension. No focal infiltrates are seen.  Bony structures demonstrate mild degenerative change of the mid thoracic spine and are otherwise intact.  IMPRESSION: Increase in interstitial septal lines suggests developing mild interstitial edema.  Otherwise unchanged cardiopulmonary appearance  Original Report Authenticated By: Bertha Stakes, M.D.   Dg Chest 2 View  11/25/2011  *RADIOLOGY REPORT*  Clinical Data: Shortness of breath  CHEST - 2 VIEW  Comparison: CT chest dated 10/02/2005  Findings: Chronic interstitial markings.  No pleural effusion or pneumothorax.  Stable cardiomegaly.  Degenerative changes of the visualized thoracolumbar spine.  IMPRESSION: No evidence of acute cardiopulmonary disease.  Stable cardiomegaly with chronic interstitial markings.  Original Report Authenticated By: Charline Bills, M.D.   Ct Head Wo Contrast  11/25/2011  *RADIOLOGY REPORT*  Clinical Data: Code stroke; sudden onset of left-sided weakness.  CT HEAD WITHOUT CONTRAST  Technique:  Contiguous axial images were obtained from the base of the skull through the vertex without contrast.  Comparison: None.  Findings: There is no evidence of acute infarction, mass lesion, or intra- or extra-axial hemorrhage on CT.  There is chronic encephalomalacia at the right frontoparietal region, reflecting remote infarct.  In addition, there is a chronic lacunar infarct within the left caudate.  Mild cerebellar atrophy is noted.  Scattered periventricular and subcortical white matter change likely reflects small vessel ischemic microangiopathy.  The  brainstem and fourth ventricle are within normal limits.  The third and lateral ventricles are unremarkable in appearance.  The cerebral hemispheres demonstrate grossly normal gray-white differentiation.  No mass effect or midline shift is seen.  There is  no evidence of fracture; visualized osseous structures are unremarkable in appearance.  The visualized portions of the orbits are within normal limits.  The paranasal sinuses and mastoid air cells are well-aerated.  No significant soft tissue abnormalities are seen.  IMPRESSION:  1.  No acute of free pathology seen on CT. 2.  Chronic encephalomalacia at the right frontoparietal region, reflecting remote infarct. 3.  Chronic lacunar infarct in the left caudate. 4.  Scattered small vessel ischemic microangiopathy.  Critical Value/emergent results were called by telephone at the time of interpretation on 11/25/2011  at 10:07 p.m.  to  Dr. Linwood Dibbles, who verbally acknowledged these results.  Original Report Authenticated By: Tonia Ghent, M.D.   Mr Brain Wo Contrast  11/26/2011  *RADIOLOGY REPORT*  Clinical Data:  Left arm weakness. Acute but ill defined cerebrovascular disease.  MRI HEAD WITHOUT CONTRAST MRA HEAD WITHOUT CONTRAST  Technique:  Multiplanar, multiecho pulse sequences of the brain and surrounding structures were obtained without intravenous contrast. Angiographic images of the head were obtained using MRA technique without contrast.  Comparison:  CT 11/25/2011  MRI HEAD  Findings:  Chronic infarct in the right temporal parietal lobe with encephalomalacia.  Around the periphery of this chronic infarct there are small areas of restricted diffusion compatible with acute infarction which has extended from the chronic infarct.  No other areas of acute infarct are identified.  No hemorrhage is present.  Mild atrophy.  Chronic infarct in the caudate bilaterally and in the cerebral white matter bilaterally.  Brainstem is intact.  Small chronic infarcts in the cerebellum bilaterally.  Negative for mass lesion.  IMPRESSION: Chronic infarct right temporal parietal lobe.  Multiple small areas of acute infarct are seen around the periphery of the chronic infarct.  MRA HEAD  Findings: Both vertebral arteries are  patent to the basilar.  The basilar is tortuous and patent.  Both posterior cerebral arteries are patent.  Internal carotid artery is patent bilaterally.  There is atherosclerotic irregularity and mild stenosis in the cavernous carotid bilaterally.  Anterior and middle cerebral arteries are patent.  Decreased signal in the intracranial circulation due to motion.  There is irregularity of the anterior and middle cerebral artery suggesting atherosclerotic disease.  It is difficult to determine if there are  branch occlusions given the poor signal in the middle cerebral artery.  Image quality degraded by patient motion  IMPRESSION: Suboptimal study due to motion.  Intracranial atherosclerotic disease, likely  moderate in nature.  Original Report Authenticated By: Camelia Phenes, M.D.   Mr Mra Head/brain Wo Cm  11/26/2011  *RADIOLOGY REPORT*  Clinical Data:  Left arm weakness. Acute but ill defined cerebrovascular disease.  MRI HEAD WITHOUT CONTRAST MRA HEAD WITHOUT CONTRAST  Technique:  Multiplanar, multiecho pulse sequences of the brain and surrounding structures were obtained without intravenous contrast. Angiographic images of the head were obtained using MRA technique without contrast.  Comparison:  CT 11/25/2011  MRI HEAD  Findings:  Chronic infarct in the right temporal parietal lobe with encephalomalacia.  Around the periphery of this chronic infarct there are small areas of restricted diffusion compatible with acute infarction which has extended from the chronic infarct.  No other areas of acute infarct are identified.  No hemorrhage is present.  Mild atrophy.  Chronic infarct in the caudate bilaterally and in the cerebral white matter bilaterally.  Brainstem is intact.  Small chronic infarcts in the cerebellum bilaterally.  Negative for mass lesion.  IMPRESSION: Chronic infarct right temporal parietal lobe.  Multiple small areas of acute infarct are seen around the periphery of the chronic infarct.  MRA  HEAD  Findings: Both vertebral arteries are patent to the basilar.  The basilar is tortuous and patent.  Both posterior cerebral arteries are patent.  Internal carotid artery is patent bilaterally.  There is atherosclerotic irregularity and mild stenosis in the cavernous carotid bilaterally.  Anterior and middle cerebral arteries are patent.  Decreased signal in the intracranial circulation due to motion.  There is irregularity of the anterior and middle cerebral artery suggesting atherosclerotic disease.  It is difficult to determine if there are  branch occlusions given the poor signal in the middle cerebral artery.  Image quality degraded by patient motion  IMPRESSION: Suboptimal study due to motion.  Intracranial atherosclerotic disease, likely  moderate in nature.  Original Report Authenticated By: Camelia Phenes, M.D.   2D ECHO: -Left ventricle: The cavity size was normal. Wall thickness was normal. Systolic function was normal. The estimated ejection fraction was in the range of 50% to 55%. - Mitral valve: The findings are consistent with moderate stenosis. Moderate regurgitation. Valve area by pressure half-time: 1.76cm^2. - Left atrium: The atrium was severely dilated. - Right atrium: The atrium was mildly dilated. - Pulmonary arteries: Systolic pressure was moderately increased. PA peak pressure: 47mm Hg (S).     Brief H and P: For complete details please refer to admission H and P, but in brief patient is a 66 Yo white woman with history of CVA involving the left hemiparesis here with another episode of worsening weakness, tingling and numbness of her left upper extremity     Hospital Course:  Principal Problem:  *CVA (cerebral vascular accident) Active Problems:  TOBACCO ABUSE  HYPERTENSION  Atrial fibrillation  Acute left-sided weakness  Left arm numbness   #1 CVA: MRI findings as above, most consistent with embolic CVA probably from her atrial fibrillation. Plan is to  continue Coumadin for secondary stroke prophylaxis as well as aspirin. She is to continue her statin. She has been seen by physical and occupational therapy who are recommending home health services. This will be arranged prior to discharge.  Time spent on Discharge: Greater than 30 minutes.  SignedChaya Jan [facility] Pager: [phone] 11/28/2011, 9:15 AM

## 2011-11-28 NOTE — Progress Notes (Signed)
ANTICOAGULATION CONSULT NOTE - Follow Up Consult  Pharmacy Consult for Warfarin Indication: atrial fibrillation and history of CVA  No Known Allergies  Patient Measurements: Height: 5\' 4"  (162.6 cm) Weight: 160 lb 0.9 oz (72.6 kg) IBW/kg (Calculated) : 54.7   Vital Signs: Temp: 98.2 F (36.8 C) (01/20 0600) Temp src: Oral (01/20 0600) BP: 133/76 mmHg (01/20 0600) Pulse Rate: 71  (01/20 0600)  Labs:  Basename 11/28/11 0605 11/27/11 0500 11/25/11 2203 11/25/11 2149  HGB -- -- 16.0* 14.8  HCT -- -- 47.0* 44.3  PLT -- -- -- 252  APTT -- -- -- 44*  LABPROT 36.2* 29.5* -- 23.6*  INR 3.57* 2.75* -- 2.06*  HEPARINUNFRC -- -- -- --  CREATININE -- -- 0.80 0.69  CKTOTAL -- -- -- 62  CKMB -- -- -- 1.8  TROPONINI -- -- -- <0.30   Estimated Creatinine Clearance: 69.4 ml/min (by C-G formula based on Cr of 0.8).  MRI= multiple acute R temporal infarcts.  Medications:  Scheduled:     . aspirin  300 mg Rectal Daily   Or  . aspirin  325 mg Oral Daily  . cholecalciferol  1,000 Units Oral QHS  . furosemide  80 mg Oral Daily  . metoprolol succinate  100 mg Oral Daily  . potassium chloride SA  20 mEq Oral Daily  . simvastatin  10 mg Oral QHS  . warfarin  5 mg Oral ONCE-1800    Assessment: 66 yo F on Coumadin PTA for afib and hx of CVA admitted with acute stoke symptoms. CT scan negative for bleed. Acute infarcts seen on MRI-was therapeutic on Coumadin at the time of infarcts. To continue Coumadin. Home regimen was 5mg  po daily except 2.5mg  on Wed and Sun. INR today is 3.57-supratherapeutic despite home regimen. Not on any significant potentiators of Coumadin. No CBC available.   Goal of Therapy:  INR 2-3   Plan:  1. No Coumadin today.   2. Monitor PT/INR daily                                                                                                                                                            [name] 11/28/2011,10:32 AM

## 2011-11-28 NOTE — Progress Notes (Addendum)
   CARE MANAGEMENT NOTE 11/28/2011  Patient:  Yvonne Collier, Yvonne Collier   Account Number:  [id number]  Date Initiated:  11/28/2011  Documentation initiated by:  [facility]  Subjective/Objective Assessment:   CVA     Action/Plan:   Anticipated DC Date:  11/28/2011   Anticipated DC Plan:  HOME W HOME HEALTH SERVICES      DC Planning Services  CM consult      [facility] Choice  HOME HEALTH   Choice offered to / List presented to:  C-1 Patient        HH arranged  HH-2 PT  HH-3 OT      [facility] agency  [facility]   Status of service:  Completed, signed off Medicare Important Message given?   (If response is "NO", the following Medicare IM given date fields will be blank) Date Medicare IM given:   Date Additional Medicare IM given:    Discharge Disposition:  HOME W HOME HEALTH SERVICES  Per UR Regulation:    Comments:   11/28/2011 1505 Contacted pt and provided her with information on [facility]. Isidoro Donning RN CCM Case Mgmt phone [phone]   11/28/2011 1500 Offered choice to pt for [facility]. Requested agency that will accept her insurance vcoverage. Contacted [facility] HH for [facility] PT/OT for scheduled d/c today. Faxed order, F2F, d/c summary and facesheet to [facility]. Isidoro Donning RN CCM Case Mgmt phone  [phone]

## 2011-11-29 NOTE — Progress Notes (Signed)
11/28/2011 1220 Contacted pt at home to see if she heard from [facility]. States they called and left message. Encouraged pt to call agency to follow up on therapy. Gave pt agency phone number to call. States she will call today.  Isidoro Donning RN CCM Case Mgmt phone [phone]

## 2011-12-01 ENCOUNTER — Telehealth: Payer: Self-pay | Admitting: *Deleted

## 2011-12-01 NOTE — Telephone Encounter (Signed)
Went out to do PT evaluation. Wanting to get clarifications orders to  Go out 2 x's for 1 week, 3 x's for 4 wks, and 2x's for 3 weeks for PT to increase strength & balance. Is this ok?Marland Kitchen..12/01/11@3 :55pm/LMB

## 2011-12-02 NOTE — Telephone Encounter (Signed)
Yes - please make sure pt has hosp f/u OV sched with me to review - thanks

## 2011-12-02 NOTE — Telephone Encounter (Signed)
Notified Lorie LMOM md response. Pt is already schedule to see md on 12/08/11/LMB

## 2011-12-08 ENCOUNTER — Telehealth: Payer: Self-pay | Admitting: *Deleted

## 2011-12-08 ENCOUNTER — Encounter: Payer: Self-pay | Admitting: Internal Medicine

## 2011-12-08 ENCOUNTER — Ambulatory Visit (INDEPENDENT_AMBULATORY_CARE_PROVIDER_SITE_OTHER): Payer: Medicare Other | Admitting: Internal Medicine

## 2011-12-08 VITALS — BP 128/82 | HR 84 | Temp 97.1°F | Wt 157.8 lb

## 2011-12-08 DIAGNOSIS — F329 Major depressive disorder, single episode, unspecified: Secondary | ICD-10-CM

## 2011-12-08 DIAGNOSIS — F17200 Nicotine dependence, unspecified, uncomplicated: Secondary | ICD-10-CM

## 2011-12-08 DIAGNOSIS — E538 Deficiency of other specified B group vitamins: Secondary | ICD-10-CM

## 2011-12-08 DIAGNOSIS — I4891 Unspecified atrial fibrillation: Secondary | ICD-10-CM

## 2011-12-08 DIAGNOSIS — I69959 Hemiplegia and hemiparesis following unspecified cerebrovascular disease affecting unspecified side: Secondary | ICD-10-CM

## 2011-12-08 MED ORDER — CYANOCOBALAMIN 1000 MCG/ML IJ SOLN
1000.0000 ug | Freq: Once | INTRAMUSCULAR | Status: AC
  Administered 2011-12-08: 1000 ug via INTRAMUSCULAR

## 2011-12-08 MED ORDER — VARENICLINE TARTRATE 0.5 MG X 11 & 1 MG X 42 PO MISC: ORAL | Status: DC

## 2011-12-08 MED ORDER — AMITRIPTYLINE HCL 10 MG PO TABS: 10.0000 mg | ORAL_TABLET | Freq: Every day | ORAL | Status: AC

## 2011-12-08 NOTE — Assessment & Plan Note (Signed)
Hx CVA 1995, mild residual L HP - recurrent 11/2011 event on coumadin for same - [facility] follows same continue present plan and medications.

## 2011-12-08 NOTE — Telephone Encounter (Signed)
Yes thanks 

## 2011-12-08 NOTE — Telephone Encounter (Signed)
[facility] Speech Therapist called for verbal order for speech therapy 2x/week for 6 weeks-okay for verbal order?

## 2011-12-08 NOTE — Assessment & Plan Note (Signed)
Hx same - currently seems situational following recent CVA - "afraid" Will tx insomnia component with amitriptyline - erx done Pt to call if symptoms worse or unimproved, esp on chantix rx Verified no SI/HI today

## 2011-12-08 NOTE — Assessment & Plan Note (Signed)
Ready to quit given recent (recurrent) CVA 11/2011 chantix rx done - we reviewed potential risk/benefit and possible side effects - pt understands and agrees to same

## 2011-12-08 NOTE — Assessment & Plan Note (Addendum)
Embolic right CVA - initial event 1995 > L HP, mild Hospitalization for recurrent symptoms January 2013 - recent hospitalization, labs and imaging reviewed today On chronic anticoagulation -[facility] for same Medical management with aspirin and statin Reminded of need for tobacco cessation and medication compliance Continue home health physical therapy as ordered

## 2011-12-08 NOTE — Progress Notes (Signed)
Subjective:    Patient ID: Yvonne Collier, female    DOB: 12/06/1945, 66 y.o.   MRN: [id number]  HPI   here for hospital followup -  January 17-20, 2013 for recurrent right CVA symptoms and increased left hemiparesis (arm>leg) -  Since home, working with [facility] PT/OT > ?slowly improved strength Ready to quit smoking - ?chantix Also emotional - tearful and poor sleep - ?meds for depression and sleep  Also reviewed chronic medical issues:  COPD - prev quit smoking but resumed spring 2011  still planning to quit -stuck at 2cig/d -No recent flares   dyslipidemia - resumed low dose stain 09/2010 - ongoing gemfib - reports compliance with ongoing medical treatment and no changes in medication dose or frequency. denies adverse side effects related to current therapy.   osteoporosis - dexa 09/2010 with -3.7 at spine - started fosamax for same and Ca+ D - changed to Reclast 12/2010 due to poor oral med compliance. denies adverse side effects related to current therapy    A fib -chronic anticoag - reports compliance with ongoing medical treatment and no changes in medication dose or frequency. denies adverse side effects related to current therapy. follows with [facility] for same - no shortness of breath or edema     Past Medical History  Diagnosis Date  . Atrial fibrillation     chronic anticoag  . PULMONARY EMBOLISM, HX OF 2001  . ASTHMA   . CONGESTIVE HEART FAILURE   . CHRONIC OBSTRUCTIVE PULMONARY DISEASE     ongoing tobacco  . DEPRESSION   . CARDIAC MURMUR   . HYPERLIPIDEMIA   . HYPERTENSION     off meds since 2011 due to sx hypotension  . POSTMENOPAUSAL OSTEOPOROSIS     intol fosamax (noncomp), reclast started 12/2010  . TOBACCO ABUSE   . RHEUMATIC FEVER, HX OF   . Stroke 1995, 11/2011    residual left hemiparesis  . Arthritis   . Chronic high back pain      Review of Systems  Constitutional: Positive for fatigue. Negative for fever.  Cardiovascular: Negative for chest  pain and palpitations.  Neurological: Negative for dizziness, syncope, weakness, light-headedness and headaches.       Objective:   Physical Exam  BP 128/82  Pulse 84  Temp(Src) 97.1 F (36.2 C) (Oral)  Wt 157 lb 12.8 oz (71.578 kg)  SpO2 98%  Wt Readings from Last 3 Encounters:  12/08/11 157 lb 12.8 oz (71.578 kg)  11/26/11 160 lb 0.9 oz (72.6 kg)  06/09/11 156 lb 1.9 oz (70.816 kg)   Constitutional: She is overweight;  appears well-developed and well-nourished. No distress.  Neck: Normal range of motion. Neck supple. No JVD present. No thyromegaly present.  Cardiovascular: Normal rate, irregular rhythm and normal heart sounds.  No murmur heard. No BLE edema - but chronically "fatty" ankles Pulmonary/Chest: Effort normal and breath sounds normal. No respiratory distress. She has no wheezes.  Neurological: L HP UE>LE. alert and oriented to person, place, and time. No cranial nerve deficit. No dysarthria or aphasia.  Psychiatric: She has a dysphoric mood and occ tearful affect. Her behavior is normal. Judgment and thought content normal.   Lab Results  Component Value Date   WBC 9.9 11/25/2011   HGB 16.0* 11/25/2011   HCT 47.0* 11/25/2011   PLT 252 11/25/2011   CHOL 128 11/26/2011   TRIG 82 11/26/2011   HDL 44 11/26/2011   LDLDIRECT 150.2 09/22/2010   ALT 15 11/25/2011  AST 24 11/25/2011   NA 140 11/25/2011   K 3.3* 11/25/2011   CL 100 11/25/2011   CREATININE 0.80 11/25/2011   BUN 12 11/25/2011   CO2 28 11/25/2011   TSH 2.93 07/29/2009   INR 3.57* 11/28/2011   HGBA1C 5.8* 11/26/2011       Assessment & Plan:  See problem list. Medications and labs reviewed today.

## 2011-12-08 NOTE — Patient Instructions (Signed)
It was good to see you today. We have reviewed your hospital records including labs and tests today Medications reviewed, continue all medications as prescribed and also start Chantix for smoking and amitriptyline for sleep Your prescription(s) have been submitted to your pharmacy. Please take as directed and contact our office if you believe you are having problem(s) with the medication(s). Continue to work with Coumadin clinic and your home health therapy as ongoing Please schedule followup in 3 months, call sooner if problems or if depression symptoms are worse.

## 2011-12-09 ENCOUNTER — Other Ambulatory Visit: Payer: Self-pay | Admitting: *Deleted

## 2011-12-09 ENCOUNTER — Ambulatory Visit (INDEPENDENT_AMBULATORY_CARE_PROVIDER_SITE_OTHER): Payer: Self-pay | Admitting: Internal Medicine

## 2011-12-09 DIAGNOSIS — R0989 Other specified symptoms and signs involving the circulatory and respiratory systems: Secondary | ICD-10-CM

## 2011-12-09 DIAGNOSIS — Z8679 Personal history of other diseases of the circulatory system: Secondary | ICD-10-CM

## 2011-12-09 DIAGNOSIS — I4891 Unspecified atrial fibrillation: Secondary | ICD-10-CM

## 2011-12-09 DIAGNOSIS — I69959 Hemiplegia and hemiparesis following unspecified cerebrovascular disease affecting unspecified side: Secondary | ICD-10-CM

## 2011-12-09 DIAGNOSIS — Z86718 Personal history of other venous thrombosis and embolism: Secondary | ICD-10-CM

## 2011-12-09 LAB — PROTIME-INR: INR: 2.4 — AB (ref 0.9–1.1)

## 2011-12-09 MED ORDER — ASPIRIN 81 MG PO TABS: 81.0000 mg | ORAL_TABLET | Freq: Every day | ORAL | Status: AC

## 2011-12-09 NOTE — Telephone Encounter (Signed)
Left msg on vm pt saw md yesterday was told to take Aspirin EC 325 mg. Pt didn't realize it was aspirin and took that and a regular 325 aspirin yesterday & today. Pt is on coumadin, and wanted to verify if she need to be taking both....12/09/11@12 :18pm/LB

## 2011-12-09 NOTE — Telephone Encounter (Signed)
Called [facility] no answer LMOM RTC.Marland KitchenMarland Kitchen1/31/13@1 :42pm/LMB

## 2011-12-09 NOTE — Telephone Encounter (Signed)
[facility] Speech Therapist informed of order.

## 2011-12-09 NOTE — Telephone Encounter (Signed)
Called Yvonne Collier gave md response. Also called pt no answer LMOM RTC concerning med change....12/09/11@3 :04pm/LMB

## 2011-12-09 NOTE — Telephone Encounter (Signed)
Pt return call gave md response concerning aspirin.Marland KitchenMarland Kitchen1/31/13@3 :12pm/LMB

## 2011-12-09 NOTE — Telephone Encounter (Signed)
Yes - ok to take ASA with coumadin, but change to 81mg  ASA qd - thanks

## 2011-12-10 ENCOUNTER — Encounter: Payer: Medicare Other | Admitting: *Deleted

## 2011-12-13 ENCOUNTER — Telehealth: Payer: Self-pay

## 2011-12-13 NOTE — Telephone Encounter (Signed)
Verbal ok?

## 2011-12-13 NOTE — Telephone Encounter (Signed)
OT called to inform MD that he has conducted his evaluation with this patient and is requesting MD authorization to continue with treatment, please advise.

## 2011-12-13 NOTE — Telephone Encounter (Signed)
Notified Kathlene November with md response... 12/13/11@4 :38pm/LMB

## 2011-12-17 ENCOUNTER — Telehealth: Payer: Self-pay | Admitting: *Deleted

## 2011-12-17 NOTE — Telephone Encounter (Signed)
Left msg on vm have already received orders, but needing to change frequency from twice a week to once a week effective 12/20/11 for 5 weeks.... 12/17/11@9 :17am/LMB

## 2011-12-17 NOTE — Telephone Encounter (Signed)
ok 

## 2011-12-17 NOTE — Telephone Encounter (Signed)
Notified mageline with md response.. 12/17/11@1 :57pm/LMB

## 2011-12-22 ENCOUNTER — Other Ambulatory Visit: Payer: Self-pay

## 2011-12-22 NOTE — Telephone Encounter (Signed)
Doxy twice a day x7 days - erx done, office visit if symptoms worse or unimproved

## 2011-12-22 NOTE — Telephone Encounter (Signed)
HHRN called to report pt has been complaining of SOB, wheezing, productive cough (clear), stuffy nose and RN hears crackles bilaterally. RN is requests ABX on pt's behalf - pt says she does not want to travel for OV. Please advise, Rx without OV?

## 2011-12-23 MED ORDER — DOXYCYCLINE HYCLATE 100 MG PO TABS: 100.0000 mg | ORAL_TABLET | Freq: Two times a day (BID) | ORAL | Status: AC

## 2011-12-23 NOTE — Telephone Encounter (Signed)
Pt advised of Rx/pharmacy 

## 2011-12-27 ENCOUNTER — Telehealth: Payer: Self-pay

## 2011-12-27 ENCOUNTER — Ambulatory Visit (INDEPENDENT_AMBULATORY_CARE_PROVIDER_SITE_OTHER): Payer: Self-pay | Admitting: Cardiology

## 2011-12-27 DIAGNOSIS — I69959 Hemiplegia and hemiparesis following unspecified cerebrovascular disease affecting unspecified side: Secondary | ICD-10-CM

## 2011-12-27 DIAGNOSIS — Z8679 Personal history of other diseases of the circulatory system: Secondary | ICD-10-CM

## 2011-12-27 DIAGNOSIS — I4891 Unspecified atrial fibrillation: Secondary | ICD-10-CM

## 2011-12-27 DIAGNOSIS — R0989 Other specified symptoms and signs involving the circulatory and respiratory systems: Secondary | ICD-10-CM

## 2011-12-27 DIAGNOSIS — Z86718 Personal history of other venous thrombosis and embolism: Secondary | ICD-10-CM

## 2011-12-27 NOTE — Telephone Encounter (Signed)
RN called to report pt PT INR. INR 3.2 PT 37.5. Pt takes 5 mg 4/week and 2.5 3/week

## 2011-12-27 NOTE — Telephone Encounter (Signed)
Her Coumadin is managed by our Coumadin clinic with cardiology- I will forward this message to them. Please be sure RN understands to call with our Coumadin clinic for INR levels and adjustment as needed

## 2011-12-28 NOTE — Telephone Encounter (Signed)
Coumadin clinic dosed INR yesterday while RN was in pt's home.

## 2012-01-11 ENCOUNTER — Ambulatory Visit: Payer: Self-pay | Admitting: Cardiology

## 2012-01-11 DIAGNOSIS — Z8679 Personal history of other diseases of the circulatory system: Secondary | ICD-10-CM

## 2012-01-11 DIAGNOSIS — I4891 Unspecified atrial fibrillation: Secondary | ICD-10-CM

## 2012-01-11 DIAGNOSIS — Z86718 Personal history of other venous thrombosis and embolism: Secondary | ICD-10-CM

## 2012-01-11 DIAGNOSIS — I69959 Hemiplegia and hemiparesis following unspecified cerebrovascular disease affecting unspecified side: Secondary | ICD-10-CM

## 2012-01-17 ENCOUNTER — Other Ambulatory Visit: Payer: Self-pay | Admitting: Internal Medicine

## 2012-01-18 ENCOUNTER — Telehealth: Payer: Self-pay | Admitting: *Deleted

## 2012-01-18 ENCOUNTER — Ambulatory Visit: Payer: Self-pay | Admitting: Cardiology

## 2012-01-18 DIAGNOSIS — I69959 Hemiplegia and hemiparesis following unspecified cerebrovascular disease affecting unspecified side: Secondary | ICD-10-CM

## 2012-01-18 DIAGNOSIS — Z8679 Personal history of other diseases of the circulatory system: Secondary | ICD-10-CM

## 2012-01-18 DIAGNOSIS — Z86718 Personal history of other venous thrombosis and embolism: Secondary | ICD-10-CM

## 2012-01-18 DIAGNOSIS — I4891 Unspecified atrial fibrillation: Secondary | ICD-10-CM

## 2012-01-18 MED ORDER — FUROSEMIDE 80 MG PO TABS: 80.0000 mg | ORAL_TABLET | Freq: Two times a day (BID) | ORAL | Status: DC

## 2012-01-18 NOTE — Telephone Encounter (Signed)
Ok, noted, thanks

## 2012-01-18 NOTE — Telephone Encounter (Signed)
Left msg on vm Monday ot last day of service was 01/14/12. She has met her goals... 01/18/12@10 :05am/LMB

## 2012-02-07 ENCOUNTER — Ambulatory Visit: Payer: Medicare Other

## 2012-02-08 ENCOUNTER — Ambulatory Visit (INDEPENDENT_AMBULATORY_CARE_PROVIDER_SITE_OTHER): Payer: Medicare Other | Admitting: *Deleted

## 2012-02-08 DIAGNOSIS — I69959 Hemiplegia and hemiparesis following unspecified cerebrovascular disease affecting unspecified side: Secondary | ICD-10-CM

## 2012-02-08 DIAGNOSIS — I4891 Unspecified atrial fibrillation: Secondary | ICD-10-CM

## 2012-02-08 DIAGNOSIS — D518 Other vitamin B12 deficiency anemias: Secondary | ICD-10-CM

## 2012-02-08 DIAGNOSIS — Z86718 Personal history of other venous thrombosis and embolism: Secondary | ICD-10-CM

## 2012-02-08 DIAGNOSIS — Z8679 Personal history of other diseases of the circulatory system: Secondary | ICD-10-CM

## 2012-02-08 LAB — POCT INR: INR: 3.4

## 2012-02-08 MED ORDER — CYANOCOBALAMIN 1000 MCG/ML IJ SOLN
1000.0000 ug | Freq: Once | INTRAMUSCULAR | Status: AC
  Administered 2012-02-08: 1000 ug via INTRAMUSCULAR

## 2012-02-14 ENCOUNTER — Other Ambulatory Visit: Payer: Self-pay | Admitting: Internal Medicine

## 2012-03-08 ENCOUNTER — Other Ambulatory Visit (INDEPENDENT_AMBULATORY_CARE_PROVIDER_SITE_OTHER): Payer: Medicare Other

## 2012-03-08 ENCOUNTER — Ambulatory Visit (INDEPENDENT_AMBULATORY_CARE_PROVIDER_SITE_OTHER): Payer: Medicare Other | Admitting: Pharmacist

## 2012-03-08 ENCOUNTER — Encounter: Payer: Self-pay | Admitting: Internal Medicine

## 2012-03-08 ENCOUNTER — Ambulatory Visit (INDEPENDENT_AMBULATORY_CARE_PROVIDER_SITE_OTHER): Payer: Medicare Other | Admitting: Internal Medicine

## 2012-03-08 VITALS — BP 104/72 | HR 75 | Temp 97.6°F | Ht 63.0 in | Wt 157.1 lb

## 2012-03-08 DIAGNOSIS — I4891 Unspecified atrial fibrillation: Secondary | ICD-10-CM

## 2012-03-08 DIAGNOSIS — I69959 Hemiplegia and hemiparesis following unspecified cerebrovascular disease affecting unspecified side: Secondary | ICD-10-CM

## 2012-03-08 DIAGNOSIS — J4489 Other specified chronic obstructive pulmonary disease: Secondary | ICD-10-CM

## 2012-03-08 DIAGNOSIS — Z8679 Personal history of other diseases of the circulatory system: Secondary | ICD-10-CM

## 2012-03-08 DIAGNOSIS — E876 Hypokalemia: Secondary | ICD-10-CM

## 2012-03-08 DIAGNOSIS — Z86718 Personal history of other venous thrombosis and embolism: Secondary | ICD-10-CM

## 2012-03-08 DIAGNOSIS — J449 Chronic obstructive pulmonary disease, unspecified: Secondary | ICD-10-CM

## 2012-03-08 DIAGNOSIS — D518 Other vitamin B12 deficiency anemias: Secondary | ICD-10-CM

## 2012-03-08 LAB — POTASSIUM: Potassium: 3.5 mEq/L (ref 3.5–5.1)

## 2012-03-08 MED ORDER — CYANOCOBALAMIN 1000 MCG/ML IJ SOLN
1000.0000 ug | Freq: Once | INTRAMUSCULAR | Status: AC
  Administered 2012-03-08: 1000 ug via INTRAMUSCULAR

## 2012-03-08 MED ORDER — POTASSIUM CHLORIDE CRYS ER 20 MEQ PO TBCR: 20.0000 meq | EXTENDED_RELEASE_TABLET | Freq: Every day | ORAL | Status: AC

## 2012-03-08 MED ORDER — VARENICLINE TARTRATE 1 MG PO TABS: 1.0000 mg | ORAL_TABLET | Freq: Two times a day (BID) | ORAL | Status: AC

## 2012-03-08 NOTE — Assessment & Plan Note (Signed)
Hx CVA 1995, mild residual L HP - recurrent 11/2011 event, back to baseline deficits  on coumadin for same - [facility] follows same continue present plan and medications.

## 2012-03-08 NOTE — Assessment & Plan Note (Signed)
No daily pulm symptoms or recent flares - Again advised on cessation - renew chantix 5 minutes today spent counseling patient on unhealthy effects of continued tobacco abuse and encouragement of cessation including medical options available to help the patient quit smoking.

## 2012-03-08 NOTE — Progress Notes (Signed)
Subjective:    Patient ID: Yvonne Collier, female    DOB: 1946/08/21, 66 y.o.   MRN: [id number]  HPI   here for follow up - reviewed chronic medical issues:   recurrent right CVA symptoms and increased left hemiparesis (arm>leg) 11/2011 - reports improvement back to baseline s/p HHPT-  COPD - prev quit smoking but resumed spring 2011  still planning to quit -stuck at 2cig/d -No recent flares  ?refill chantix  dyslipidemia - resumed low dose stain 09/2010 - ongoing gemfib - reports compliance with ongoing medical treatment and no changes in medication dose or frequency. denies adverse side effects related to current therapy.   osteoporosis - dexa 09/2010 with -3.7 at spine - started fosamax for same and Ca+ D - changed to Reclast 12/2010 due to poor oral med compliance. denies adverse side effects related to current therapy    A fib -chronic anticoag - reports compliance with ongoing medical treatment and no changes in medication dose or frequency. denies adverse side effects related to current therapy. follows with [facility] for same - no shortness of breath or edema   chronic insomnia - using elavil qhs for same - improved  Past Medical History  Diagnosis Date  . Atrial fibrillation     chronic anticoag  . PULMONARY EMBOLISM, HX OF 2001  . ASTHMA   . CONGESTIVE HEART FAILURE   . CHRONIC OBSTRUCTIVE PULMONARY DISEASE     ongoing tobacco  . DEPRESSION   . CARDIAC MURMUR   . HYPERLIPIDEMIA   . HYPERTENSION     off meds since 2011 due to sx hypotension  . POSTMENOPAUSAL OSTEOPOROSIS     intol fosamax (noncomp), reclast started 12/2010  . TOBACCO ABUSE   . RHEUMATIC FEVER, HX OF   . Stroke 1995, 11/2011    residual left hemiparesis  . Arthritis   . Chronic high back pain      Review of Systems  Constitutional: Positive for fatigue. Negative for fever.  Cardiovascular: Negative for chest pain and palpitations.  Neurological: Negative for dizziness, syncope, weakness,  light-headedness and headaches.       Objective:   Physical Exam  BP 104/72  Pulse 75  Temp(Src) 97.6 F (36.4 C) (Oral)  Ht 5\' 3"  (1.6 m)  Wt 157 lb 1.9 oz (71.269 kg)  BMI 27.83 kg/m2  SpO2 92%  Wt Readings from Last 3 Encounters:  03/08/12 157 lb 1.9 oz (71.269 kg)  12/08/11 157 lb 12.8 oz (71.578 kg)  11/26/11 160 lb 0.9 oz (72.6 kg)   Constitutional: She is overweight;  appears well-developed and well-nourished. No distress.  Neck: Normal range of motion. Neck supple. No JVD present. No thyromegaly present.  Cardiovascular: Normal rate, irregular rhythm and normal heart sounds.  No murmur heard. No BLE edema - but chronically "fatty" ankles Pulmonary/Chest: Effort normal and breath sounds normal. No respiratory distress. She has no wheezes.  Neurological: L HP UE, mild hand "delay". alert and oriented to person, place, and time. No cranial nerve deficit. No dysarthria or aphasia.  Psychiatric: She has a dysphoric mood and occ tearful affect. Her behavior is normal. Judgment and thought content normal.   Lab Results  Component Value Date   WBC 9.9 11/25/2011   HGB 16.0* 11/25/2011   HCT 47.0* 11/25/2011   PLT 252 11/25/2011   CHOL 128 11/26/2011   TRIG 82 11/26/2011   HDL 44 11/26/2011   LDLDIRECT 150.2 09/22/2010   ALT 15 11/25/2011   AST 24  11/25/2011   NA 140 11/25/2011   K 3.3* 11/25/2011   CL 100 11/25/2011   CREATININE 0.80 11/25/2011   BUN 12 11/25/2011   CO2 28 11/25/2011   TSH 2.93 07/29/2009   INR 3.4 02/08/2012   HGBA1C 5.8* 11/26/2011       Assessment & Plan:  See problem list. Medications and labs reviewed today.  Hypokalemia - on diuretics - recheck today

## 2012-03-08 NOTE — Patient Instructions (Signed)
It was good to see you today. We have reviewed your interval medical history today Test(s) ordered today. Your results will be called to you after review (48-72hours after test completion). If any changes need to be made, you will be notified at that time. Medications reviewed, continue all medications as prescribed Refill on medication(s) as discussed today.  Continue to work with Coumadin clinic and your home health therapy as ongoing Please schedule followup in 3 months for cholesterol check, call sooner if problems

## 2012-03-10 IMAGING — CR DG THORACIC SPINE 2V
3 series · 3 of 3 positions shown · non-contrast
Comparison: CT chest 10/02/2005.

CLINICAL DATA: Back pain.

THORACIC SPINE - 2 VIEW

[view not recorded (1 of 3)]
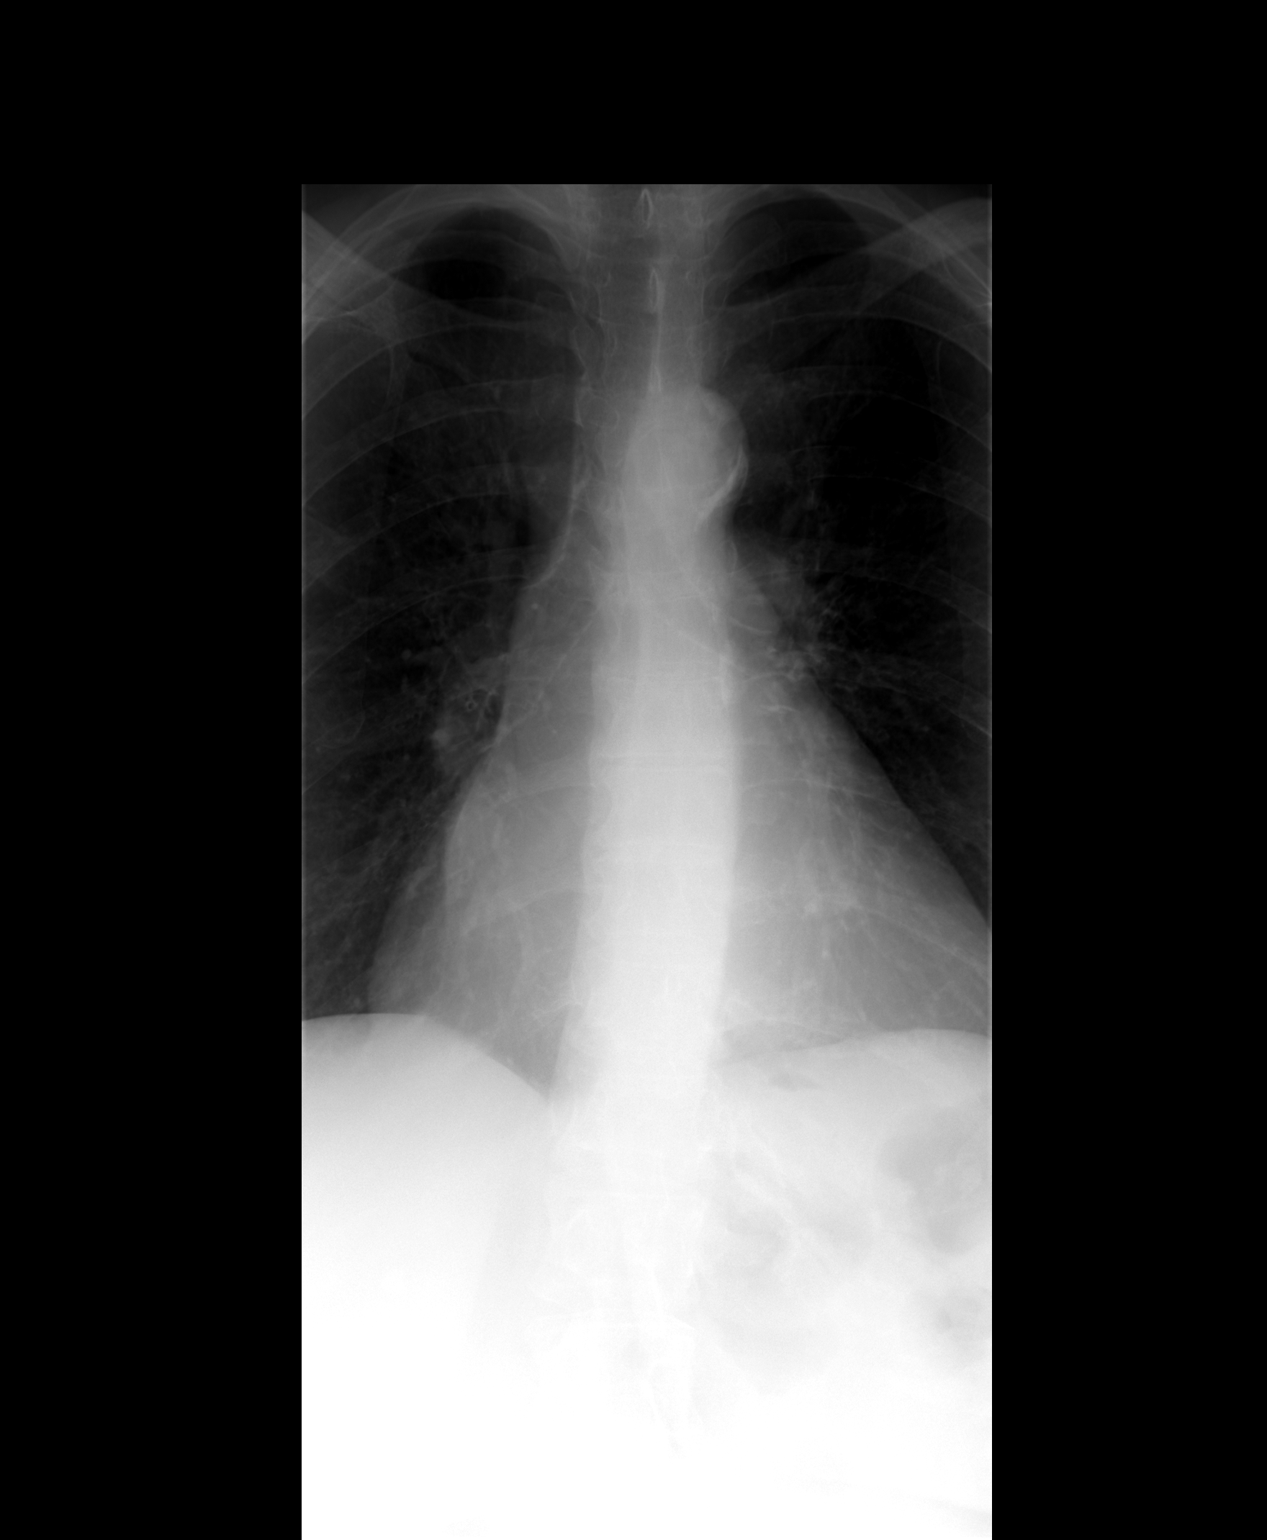

[view not recorded (2 of 3)]
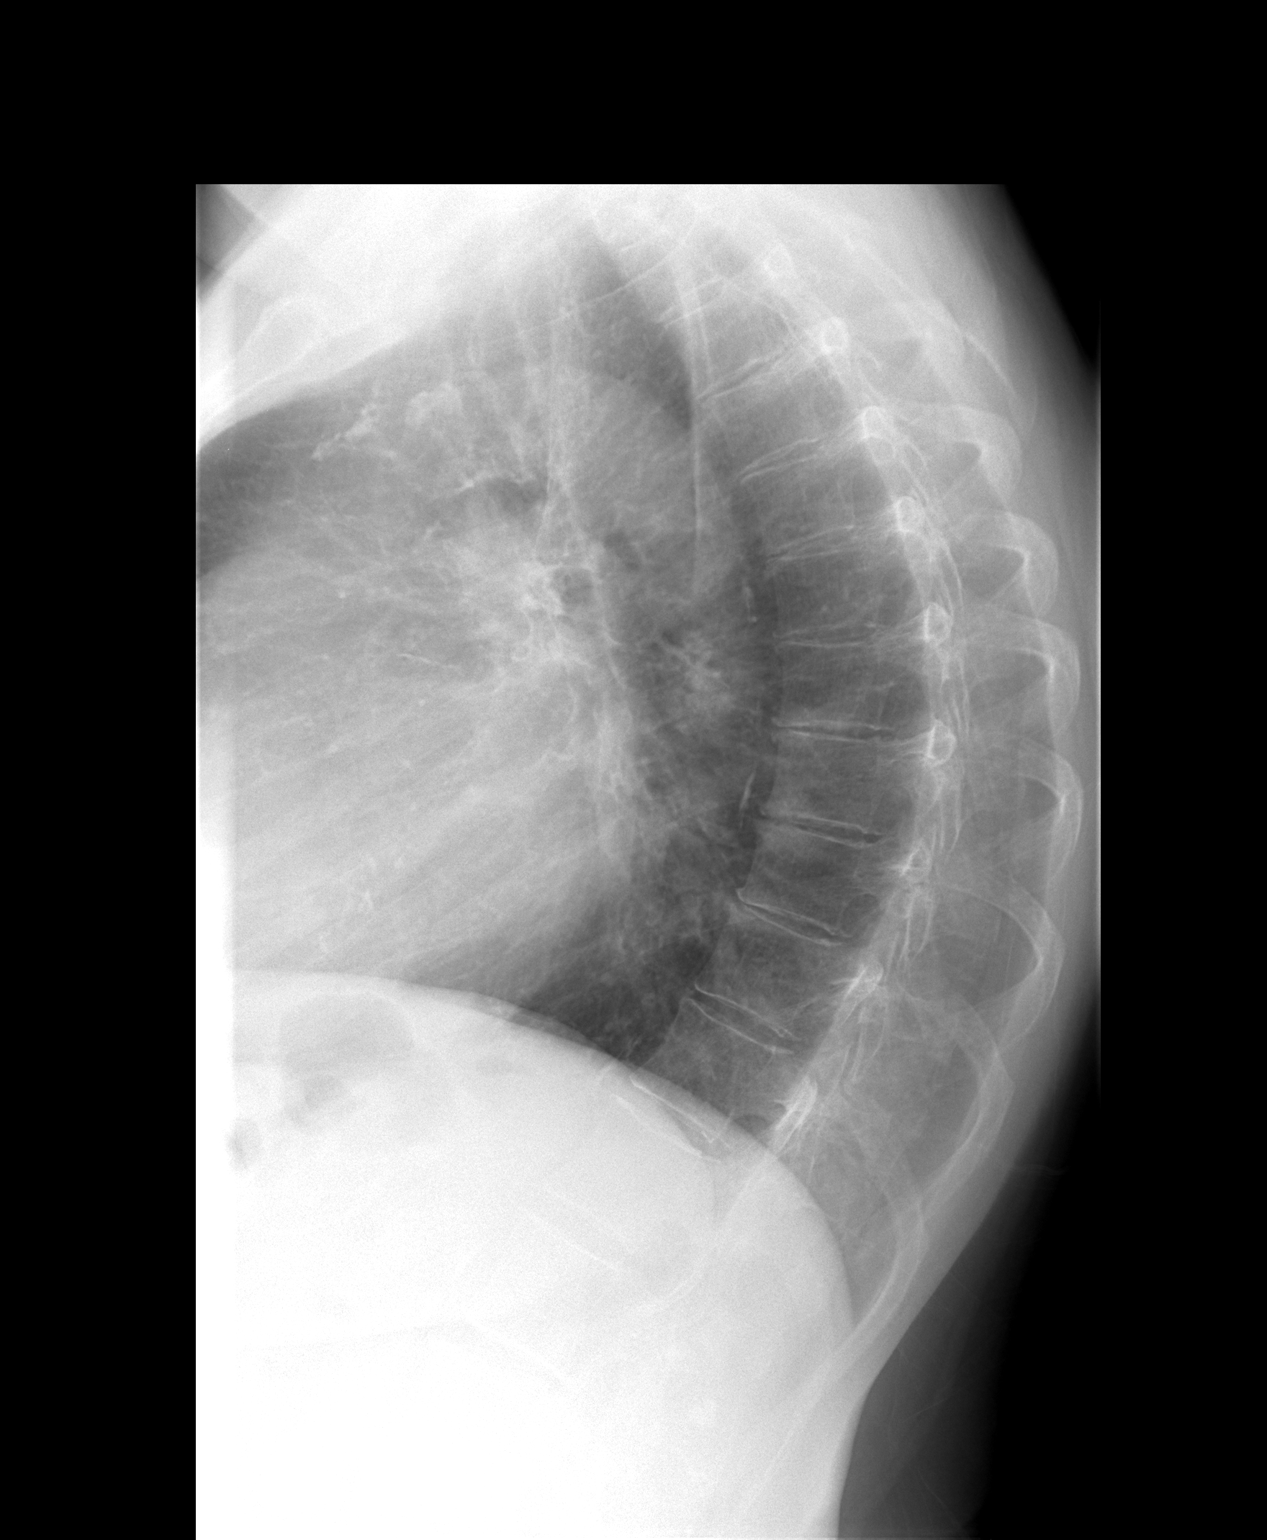

[view not recorded (3 of 3)]
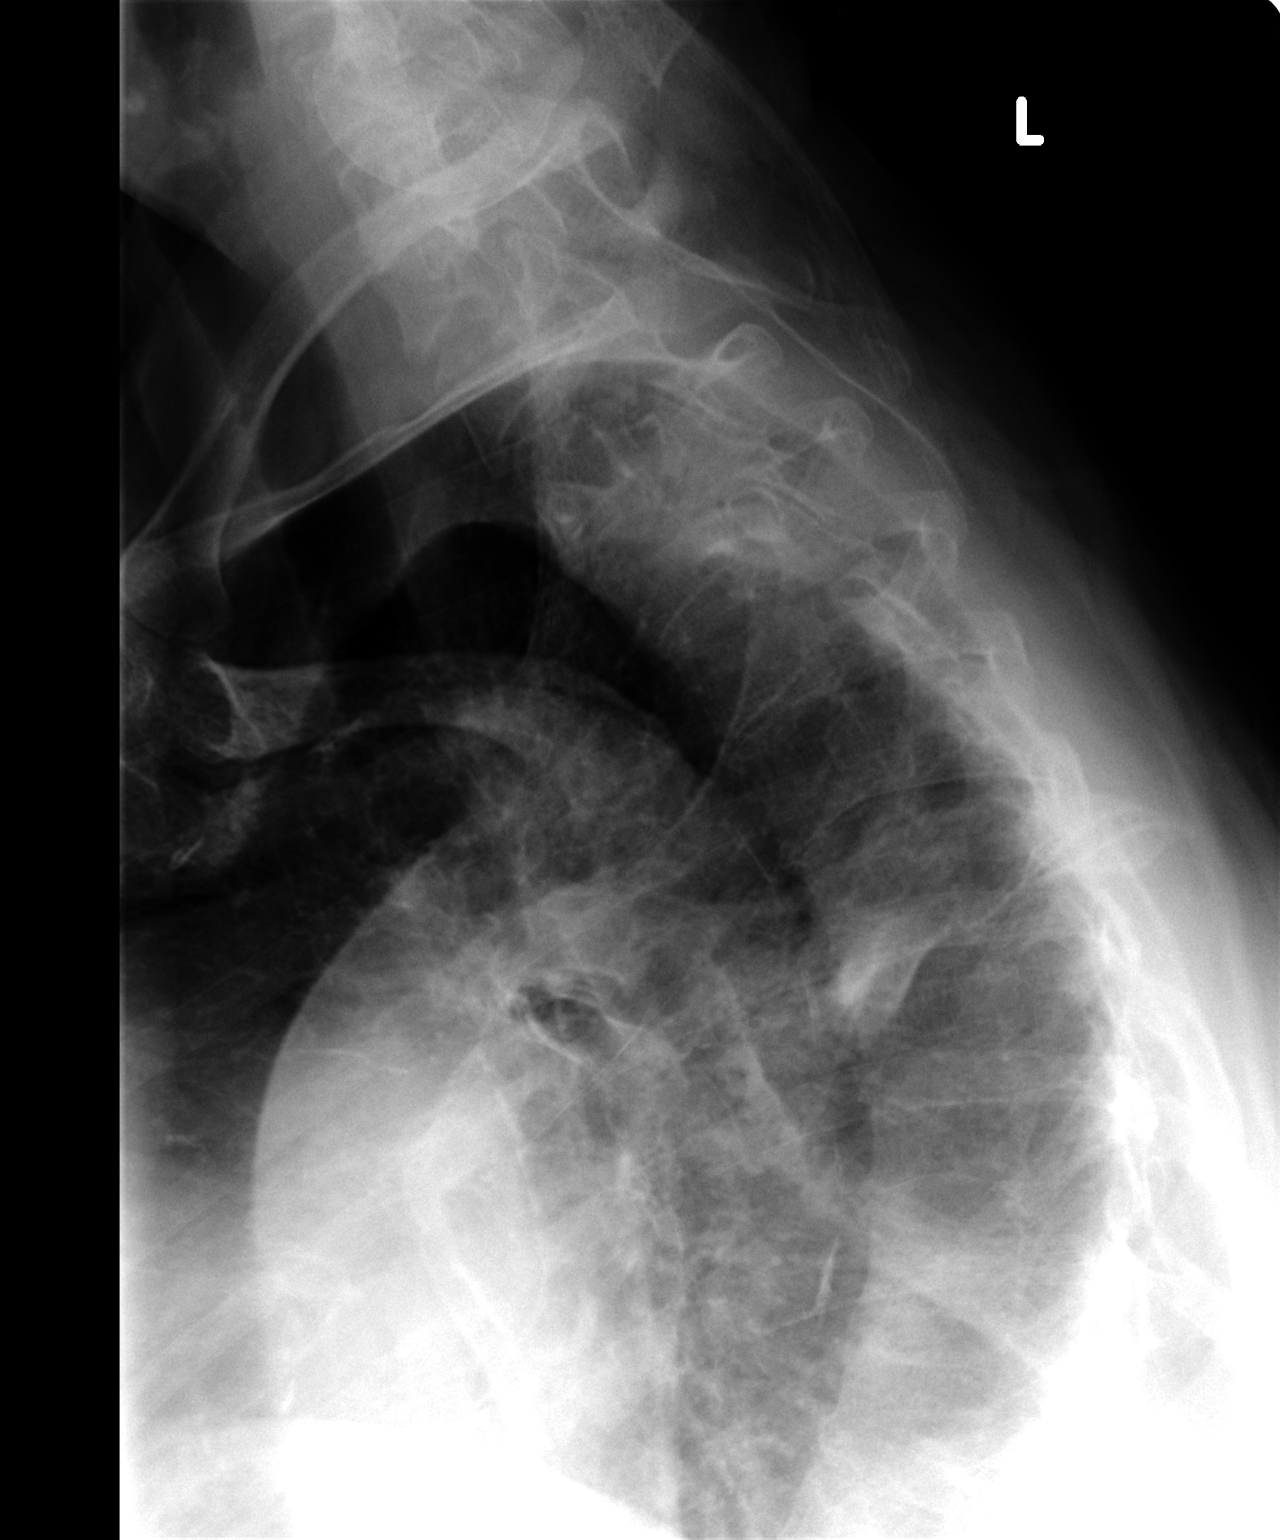

[3 of 3 positions shown; findings below may reference images not displayed]

FINDINGS: Vertebral body height and alignment are maintained.  Mild
anterior endplate spurring is seen in the mid and lower thoracic
spine noted.  Paraspinous structures unremarkable.
IMPRESSION: No acute finding.  Mild appearing spondylosis.

## 2012-04-13 LAB — PROTIME-INR: INR: 2.7 — AB (ref 0.9–1.1)

## 2012-04-14 ENCOUNTER — Ambulatory Visit: Payer: Self-pay | Admitting: Internal Medicine

## 2012-04-14 DIAGNOSIS — Z86718 Personal history of other venous thrombosis and embolism: Secondary | ICD-10-CM

## 2012-04-14 DIAGNOSIS — I4891 Unspecified atrial fibrillation: Secondary | ICD-10-CM

## 2012-04-14 DIAGNOSIS — Z8679 Personal history of other diseases of the circulatory system: Secondary | ICD-10-CM

## 2012-04-14 DIAGNOSIS — I69959 Hemiplegia and hemiparesis following unspecified cerebrovascular disease affecting unspecified side: Secondary | ICD-10-CM

## 2012-05-10 ENCOUNTER — Other Ambulatory Visit: Payer: Self-pay | Admitting: Internal Medicine

## 2012-05-23 LAB — PROTIME-INR: INR: 1.8 — AB (ref 0.9–1.1)

## 2012-05-24 ENCOUNTER — Ambulatory Visit: Payer: Self-pay | Admitting: Cardiology

## 2012-05-24 DIAGNOSIS — I69959 Hemiplegia and hemiparesis following unspecified cerebrovascular disease affecting unspecified side: Secondary | ICD-10-CM

## 2012-05-24 DIAGNOSIS — Z86718 Personal history of other venous thrombosis and embolism: Secondary | ICD-10-CM

## 2012-05-24 DIAGNOSIS — I4891 Unspecified atrial fibrillation: Secondary | ICD-10-CM

## 2012-05-24 DIAGNOSIS — Z8679 Personal history of other diseases of the circulatory system: Secondary | ICD-10-CM

## 2012-06-08 ENCOUNTER — Other Ambulatory Visit: Payer: Self-pay | Admitting: Internal Medicine

## 2012-07-05 ENCOUNTER — Ambulatory Visit: Payer: Self-pay | Admitting: Cardiovascular Disease

## 2012-07-05 DIAGNOSIS — Z8679 Personal history of other diseases of the circulatory system: Secondary | ICD-10-CM

## 2012-07-05 DIAGNOSIS — I69959 Hemiplegia and hemiparesis following unspecified cerebrovascular disease affecting unspecified side: Secondary | ICD-10-CM

## 2012-07-05 DIAGNOSIS — Z86718 Personal history of other venous thrombosis and embolism: Secondary | ICD-10-CM

## 2012-07-05 DIAGNOSIS — I4891 Unspecified atrial fibrillation: Secondary | ICD-10-CM

## 2012-07-24 ENCOUNTER — Telehealth: Payer: Self-pay | Admitting: *Deleted

## 2012-07-24 NOTE — Telephone Encounter (Signed)
Have call this patient last week and this week, today is 07/24/2012, patient has been in [place] for several months, we have requested patient have her INR checked and/or  set up with a primary care MD in [place] to help with management of her coumadin, have left another message today to f/u with her.

## 2012-08-03 ENCOUNTER — Ambulatory Visit: Payer: Self-pay | Admitting: Internal Medicine

## 2012-08-03 DIAGNOSIS — Z8679 Personal history of other diseases of the circulatory system: Secondary | ICD-10-CM

## 2012-08-03 DIAGNOSIS — Z86718 Personal history of other venous thrombosis and embolism: Secondary | ICD-10-CM

## 2012-08-03 DIAGNOSIS — I4891 Unspecified atrial fibrillation: Secondary | ICD-10-CM

## 2012-08-03 DIAGNOSIS — I69959 Hemiplegia and hemiparesis following unspecified cerebrovascular disease affecting unspecified side: Secondary | ICD-10-CM

## 2012-08-14 ENCOUNTER — Ambulatory Visit: Payer: Self-pay | Admitting: Cardiology

## 2012-08-14 DIAGNOSIS — Z8679 Personal history of other diseases of the circulatory system: Secondary | ICD-10-CM

## 2012-08-14 DIAGNOSIS — I69959 Hemiplegia and hemiparesis following unspecified cerebrovascular disease affecting unspecified side: Secondary | ICD-10-CM

## 2012-08-14 DIAGNOSIS — Z86718 Personal history of other venous thrombosis and embolism: Secondary | ICD-10-CM

## 2012-08-14 DIAGNOSIS — I4891 Unspecified atrial fibrillation: Secondary | ICD-10-CM

## 2012-09-13 ENCOUNTER — Ambulatory Visit: Payer: Medicare Other | Admitting: Internal Medicine

## 2012-10-10 ENCOUNTER — Other Ambulatory Visit: Payer: Self-pay | Admitting: Internal Medicine

## 2012-11-08 ENCOUNTER — Other Ambulatory Visit: Payer: Self-pay | Admitting: Internal Medicine

## 2013-04-09 ENCOUNTER — Other Ambulatory Visit: Payer: Self-pay | Admitting: Internal Medicine

## 2013-06-03 ENCOUNTER — Other Ambulatory Visit: Payer: Self-pay | Admitting: Internal Medicine

## 2013-07-05 ENCOUNTER — Other Ambulatory Visit: Payer: Self-pay | Admitting: Internal Medicine

## 2013-08-03 ENCOUNTER — Other Ambulatory Visit: Payer: Self-pay | Admitting: Internal Medicine

## 2014-11-01 ENCOUNTER — Emergency Department (HOSPITAL_COMMUNITY)
Admission: EM | Admit: 2014-11-01 | Discharge: 2014-11-02 | Disposition: A | Discharge status: Home / Self Care | Payer: Medicare Other | Attending: Emergency Medicine | Admitting: Emergency Medicine

## 2014-11-01 ENCOUNTER — Encounter (HOSPITAL_COMMUNITY): Payer: Self-pay | Admitting: Emergency Medicine

## 2014-11-01 DIAGNOSIS — Z86711 Personal history of pulmonary embolism: Secondary | ICD-10-CM | POA: Insufficient documentation

## 2014-11-01 DIAGNOSIS — Z792 Long term (current) use of antibiotics: Secondary | ICD-10-CM | POA: Insufficient documentation

## 2014-11-01 DIAGNOSIS — T404X1A Poisoning by other synthetic narcotics, accidental (unintentional), initial encounter: Secondary | ICD-10-CM | POA: Diagnosis not present

## 2014-11-01 DIAGNOSIS — Z720 Tobacco use: Secondary | ICD-10-CM | POA: Diagnosis not present

## 2014-11-01 DIAGNOSIS — Z7901 Long term (current) use of anticoagulants: Secondary | ICD-10-CM | POA: Insufficient documentation

## 2014-11-01 DIAGNOSIS — I4891 Unspecified atrial fibrillation: Secondary | ICD-10-CM | POA: Diagnosis not present

## 2014-11-01 DIAGNOSIS — Z8673 Personal history of transient ischemic attack (TIA), and cerebral infarction without residual deficits: Secondary | ICD-10-CM | POA: Diagnosis not present

## 2014-11-01 DIAGNOSIS — G8929 Other chronic pain: Secondary | ICD-10-CM | POA: Diagnosis not present

## 2014-11-01 DIAGNOSIS — E785 Hyperlipidemia, unspecified: Secondary | ICD-10-CM | POA: Diagnosis not present

## 2014-11-01 DIAGNOSIS — Z7982 Long term (current) use of aspirin: Secondary | ICD-10-CM | POA: Diagnosis not present

## 2014-11-01 DIAGNOSIS — F329 Major depressive disorder, single episode, unspecified: Secondary | ICD-10-CM | POA: Insufficient documentation

## 2014-11-01 DIAGNOSIS — I10 Essential (primary) hypertension: Secondary | ICD-10-CM | POA: Diagnosis not present

## 2014-11-01 DIAGNOSIS — J449 Chronic obstructive pulmonary disease, unspecified: Secondary | ICD-10-CM | POA: Insufficient documentation

## 2014-11-01 DIAGNOSIS — R011 Cardiac murmur, unspecified: Secondary | ICD-10-CM | POA: Diagnosis not present

## 2014-11-01 DIAGNOSIS — M1990 Unspecified osteoarthritis, unspecified site: Secondary | ICD-10-CM | POA: Diagnosis not present

## 2014-11-01 DIAGNOSIS — T426X1A Poisoning by other antiepileptic and sedative-hypnotic drugs, accidental (unintentional), initial encounter: Secondary | ICD-10-CM | POA: Diagnosis not present

## 2014-11-01 DIAGNOSIS — I509 Heart failure, unspecified: Secondary | ICD-10-CM | POA: Insufficient documentation

## 2014-11-01 DIAGNOSIS — R4182 Altered mental status, unspecified: Secondary | ICD-10-CM | POA: Diagnosis present

## 2014-11-01 DIAGNOSIS — Z79899 Other long term (current) drug therapy: Secondary | ICD-10-CM | POA: Diagnosis not present

## 2014-11-01 DIAGNOSIS — T50901A Poisoning by unspecified drugs, medicaments and biological substances, accidental (unintentional), initial encounter: Secondary | ICD-10-CM

## 2014-11-01 NOTE — ED Notes (Signed)
Poison control already aware of patient. Family member contacted poison control for advice.

## 2014-11-01 NOTE — ED Notes (Signed)
Patient arrives after having taken unknown medications. Reports initially that she took 2-6 tramadol 50mg  tablets and 2 ambien 10mg . However when asking patient about medications she took she reports numerous other medications may have been taken. Patient additionally has amiodarone and digitek in medications. In triage patient is somewhat lethargic, noted falling asleep with speaking with family member.

## 2014-11-02 LAB — CBC WITH DIFFERENTIAL/PLATELET
BASOS ABS: 0.1 10*3/uL (ref 0.0–0.1)
Basophils Relative: 1 % (ref 0–1)
EOS ABS: 0.1 10*3/uL (ref 0.0–0.7)
EOS PCT: 2 % (ref 0–5)
HEMATOCRIT: 30.3 % — AB (ref 36.0–46.0)
Hemoglobin: 9.4 g/dL — ABNORMAL LOW (ref 12.0–15.0)
LYMPHS PCT: 16 % (ref 12–46)
Lymphs Abs: 1.1 10*3/uL (ref 0.7–4.0)
MCH: 27.5 pg (ref 26.0–34.0)
MCHC: 31 g/dL (ref 30.0–36.0)
MCV: 88.6 fL (ref 78.0–100.0)
MONO ABS: 0.6 10*3/uL (ref 0.1–1.0)
Monocytes Relative: 8 % (ref 3–12)
Neutro Abs: 5.2 10*3/uL (ref 1.7–7.7)
Neutrophils Relative %: 73 % (ref 43–77)
PLATELETS: 244 10*3/uL (ref 150–400)
RBC: 3.42 MIL/uL — ABNORMAL LOW (ref 3.87–5.11)
RDW: 16 % — AB (ref 11.5–15.5)
WBC: 7.1 10*3/uL (ref 4.0–10.5)

## 2014-11-02 LAB — COMPREHENSIVE METABOLIC PANEL
ALBUMIN: 3.6 g/dL (ref 3.5–5.2)
ALK PHOS: 57 U/L (ref 39–117)
ALT: 23 U/L (ref 0–35)
AST: 27 U/L (ref 0–37)
Anion gap: 8 (ref 5–15)
BILIRUBIN TOTAL: 0.5 mg/dL (ref 0.3–1.2)
BUN: 12 mg/dL (ref 6–23)
CHLORIDE: 99 meq/L (ref 96–112)
CO2: 29 mmol/L (ref 19–32)
CREATININE: 0.93 mg/dL (ref 0.50–1.10)
Calcium: 8.5 mg/dL (ref 8.4–10.5)
GFR calc Af Amer: 73 mL/min — ABNORMAL LOW (ref >90)
GFR, EST NON AFRICAN AMERICAN: 63 mL/min — AB (ref >90)
Glucose, Bld: 114 mg/dL — ABNORMAL HIGH (ref 70–99)
POTASSIUM: 3.4 mmol/L — AB (ref 3.5–5.1)
SODIUM: 136 mmol/L (ref 135–145)
Total Protein: 6.4 g/dL (ref 6.0–8.3)

## 2014-11-02 LAB — DIGOXIN LEVEL: DIGOXIN LVL: 1 ng/mL (ref 0.8–2.0)

## 2014-11-02 LAB — PROTIME-INR
INR: 1.77 — AB (ref 0.00–1.49)
PROTHROMBIN TIME: 20.8 s — AB (ref 11.6–15.2)

## 2014-11-02 LAB — TROPONIN I: TROPONIN I: 0.03 ng/mL (ref <0.031)

## 2014-11-02 LAB — SALICYLATE LEVEL

## 2014-11-02 LAB — ETHANOL: Alcohol, Ethyl (B): <5 mg/dL (ref 0–9)

## 2014-11-02 LAB — ACETAMINOPHEN LEVEL: Acetaminophen (Tylenol), Serum: <10 ug/mL — ABNORMAL LOW (ref 10–30)

## 2014-11-02 NOTE — ED Notes (Addendum)
This RN followed up with poison control (pt's sister had called previously), and was told that the half life of Remus Lofflerambien is 2 hours, and as long as the pt's symptoms are resolving, the pt can be discharged. This RN was told that the length of observation is up to the doctor. This RN was also told that amiodarone can cause increased QT interval and AV blocks.

## 2014-11-02 NOTE — Discharge Instructions (Signed)
Recheck if you feel bad or different in any way.

## 2014-11-02 NOTE — ED Provider Notes (Signed)
CSN: [id number]     Arrival date & time 11/01/14  2300 History  This chart was scribed for Ward Givens, MD by Roxy Cedar, ED Scribe. This patient was seen in room A13C/A13C and the patient's care was started at 12:07 AM.   Chief Complaint  Patient presents with  . Drug Overdose  . Altered Mental Status   Patient is a 68 y.o. female presenting with Overdose and altered mental status. The history is provided by the patient. No language interpreter was used.  Drug Overdose This is a new problem. The current episode started 1 to 2 hours ago. The problem has not changed since onset.Pertinent negatives include no chest pain, no abdominal pain, no headaches and no shortness of breath. Nothing aggravates the symptoms. Nothing relieves the symptoms. She has tried nothing for the symptoms.  Altered Mental Status Associated symptoms: no abdominal pain, no fever and no headaches    HPI Comments: Yvonne Collier is a 68 y.o. female with a history of aortic valve replacement, pulmonary embolism, asthma, CHF, depression, cardiac murmur, hyperlipidemia, and hypertension who presents to the Emergency Department complaining of drug overdose and altered mental status. Per sister, patient's father is in the hospital today. Patient is visiting from out of town and has chronic back pain. She did not sleep last night because her back was hurting. Patient states that she took 2 sleeping pills, and 2 tramadol pills around 9 PM. Patient then went out to sit on the deck to smoke a cigarette.. Per sister, patient was sitting on the deck trying to smoke a cigarette and seemed lethargic and bending over in her chair. Patient was unable to ambulate on her own. Sister and her sister's husband helped her up and brought her inside. Sister states that patient seemed "loopy and lethargic". Per sister, patient has a history of chronic back pain. Patient states that she usually does not take medications as she did earlier  today, but sometimes she does when her back is really bothering her.. Patient states she took 2 tablets of 10 mg Ambien today, but she is prescribed 10 mg. She had been taking 5 g tablets but they weren't helping.  She also normally only takes 1 tramadol in the evening. Patient denies fall today. Per sister, patient has been changing her story about the amount of medications she took today. At one point she said she had taken 6 pills, however patient now does not recall saying that. Patient's sister denies history of S/I. When asked again, patient states she took Ambien, and Tramadol today. Patient's sister states that patient has a history of forgetting to take a dose of her medication and skip it from time to time, but does not have a history of medication abuse. The sister called poison control who recommended she come to the ED.   PCP OOT  Past Medical History  Diagnosis Date  . Atrial fibrillation     chronic anticoag  . PULMONARY EMBOLISM, HX OF 2001  . ASTHMA   . CONGESTIVE HEART FAILURE   . CHRONIC OBSTRUCTIVE PULMONARY DISEASE     ongoing tobacco  . DEPRESSION   . CARDIAC MURMUR   . HYPERLIPIDEMIA   . HYPERTENSION     off meds since 2011 due to sx hypotension  . POSTMENOPAUSAL OSTEOPOROSIS     intol fosamax (noncomp), reclast started 12/2010  . TOBACCO ABUSE   . RHEUMATIC FEVER, HX OF   . 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 Arthritis   . Chronic high back pain    Past Surgical History  Procedure Laterality Date  . Gastric bypass  06/2004    [place] in [place]- lap Roux n-y  . Tonsillectomy      "when I was real young"  . Appendectomy  1970's   pacemaker insertion in March Aortic valve replacement in March Another valve was repaired in March   Family History  Problem Relation Age of Onset  . Cancer Neg Hx   . Diabetes Neg Hx   . Coronary artery disease Neg Hx   . Hypertension Other    History  Substance Use Topics  . Smoking status: Current Every  Day Smoker -- 0.50 packs/day for 43 years    Types: Cigarettes  . Smokeless tobacco: Never Used     Comment: smoking cessation consult entered  . Alcohol Use: No     OB History    No data available     Review of Systems  Constitutional: Negative for fever and chills.  HENT: Negative for rhinorrhea and sore throat.   Respiratory: Negative for shortness of breath.   Cardiovascular: Negative for chest pain.  Gastrointestinal: Negative for abdominal pain.  Genitourinary: Negative for dysuria.  Musculoskeletal: Positive for back pain.  Neurological: Negative for headaches.  All other systems reviewed and are negative.  Allergies  Review of patient's allergies indicates no known allergies.  Home Medications   Prior to Admission medications   Medication Sig Start Date End Date Taking? Authorizing Provider  acetaminophen (TYLENOL) 500 MG tablet Take 1,000 mg by mouth every 6 (six) hours as needed for mild pain.   Yes Historical Provider, MD  amiodarone (PACERONE) 200 MG tablet Take 200 mg by mouth 2 (two) times daily.   Yes Historical Provider, MD  aspirin 81 MG tablet Take 1 tablet (81 mg total) by mouth daily. 12/09/11 11/02/14 Yes Newt LukesValerie A Leschber, MD  digoxin (DIGITEK) 0.25 MG tablet Take 0.25 mg by mouth daily as needed (racing heart).   Yes Historical Provider, MD  digoxin (LANOXIN) 0.125 MG tablet Take 0.125 mg by mouth daily.   Yes Historical Provider, MD  doxycycline (VIBRA-TABS) 100 MG tablet Take 100 mg by mouth daily.   Yes Historical Provider, MD  furosemide (LASIX) 80 MG tablet TAKE 1 TABLET TWICE A DAY Patient taking differently: TAKE 1 TABLET DAILY 05/10/12  Yes Newt LukesValerie A Leschber, MD  metoprolol succinate (TOPROL-XL) 100 MG 24 hr tablet TAKE 1 TABLET BY MOUTH EVERY DAY 06/03/13  Yes Newt LukesValerie A Leschber, MD  potassium chloride SA (K-DUR,KLOR-CON) 20 MEQ tablet Take 1 tablet (20 mEq total) by mouth daily. Patient taking differently: Take 20 mEq by mouth 2 (two) times a  week.  03/08/12 11/02/14 Yes Newt LukesValerie A Leschber, MD  ramipril (ALTACE) 1.25 MG capsule Take 1.25 mg by mouth daily.   Yes Historical Provider, MD  ramipril (ALTACE) 2.5 MG capsule Take 2.5 mg by mouth daily as needed (racing heart).   Yes Historical Provider, MD  simvastatin (ZOCOR) 40 MG tablet Take 40 mg by mouth at bedtime.   Yes Historical Provider, MD  traMADol (ULTRAM) 50 MG tablet Take 50 mg by mouth every 12 (twelve) hours.   Yes Historical Provider, MD  warfarin (COUMADIN) 5 MG tablet TAKE 1 TABLET BY MOUTH DAILY AS DIRECTED Patient taking differently: Take 5mg  on Monday, Wednesday and Friday. Take 2.5mg  on Tuesday, Thursday, Saturday and Sunday 10/10/12  Yes Newt LukesValerie A Leschber, MD  zolpidem [facility](AMBIEN) 10  MG tablet Take 10 mg by mouth at bedtime.   Yes Historical Provider, MD  amitriptyline (ELAVIL) 10 MG tablet Take 1 tablet (10 mg total) by mouth at bedtime. 12/08/11 12/07/12  Newt Lukes, MD  cholecalciferol (VITAMIN D) 1000 UNITS tablet Take 1,000 Units by mouth at bedtime. 06/09/11   Newt Lukes, MD  methocarbamol (ROBAXIN) 500 MG tablet TAKE 1 TABLET BY MOUTH 3 TIMES A DAY AS NEEDED FOR MUSCLE SPASM Patient not taking: Reported on 11/02/2014 01/17/12   Newt Lukes, MD  simvastatin (ZOCOR) 10 MG tablet Take 10 mg by mouth at bedtime. 10/28/11   Newt Lukes, MD  simvastatin (ZOCOR) 10 MG tablet TAKE 1 TABLET BY MOUTH EVERY DAY AT BEDTIME Patient not taking: Reported on 11/02/2014 06/08/12   Newt Lukes, MD  simvastatin (ZOCOR) 10 MG tablet TAKE 1 TABLET BY MOUTH DAILY Patient not taking: Reported on 11/02/2014 07/05/13   Newt Lukes, MD   Triage Vitals: BP 135/58 mmHg  Pulse 58  Temp(Src) 97.9 F (36.6 C) (Oral)  Resp 18  Ht 5\' 2"  (1.575 m)  Wt 138 lb (62.596 kg)  BMI 25.23 kg/m2  SpO2 94%  Vital signs normal except bradycardia   Physical Exam  Constitutional: She is oriented to person, place, and time.  Non-toxic appearance. She does not  appear ill. No distress.  Frail, elderly female. Appears sleepy but able to hold conversation.  HENT:  Head: Normocephalic and atraumatic.  Right Ear: External ear normal.  Left Ear: External ear normal.  Nose: Nose normal. No mucosal edema or rhinorrhea.  Mouth/Throat: Oropharynx is clear and moist and mucous membranes are normal. No dental abscesses or uvula swelling.  Eyes: Conjunctivae and EOM are normal. Pupils are equal, round, and reactive to light.  Neck: Normal range of motion and full passive range of motion without pain. Neck supple.  Cardiovascular: Normal rate, regular rhythm and normal heart sounds.  Exam reveals no gallop and no friction rub.   No murmur heard. Pulmonary/Chest: Effort normal and breath sounds normal. No respiratory distress. She has no wheezes. She has no rhonchi. She has no rales. She exhibits no tenderness and no crepitus.  Abdominal: Soft. Normal appearance and bowel sounds are normal. She exhibits no distension. There is no tenderness. There is no rebound and no guarding.  Musculoskeletal: Normal range of motion. She exhibits no edema or tenderness.  Moves all extremities well.   Neurological: She is alert and oriented to person, place, and time. She has normal strength. No cranial nerve deficit.  Skin: Skin is warm, dry and intact. No rash noted. No erythema. No pallor.  Psychiatric: She has a normal mood and affect. Her speech is normal and behavior is normal. Her mood appears not anxious.  Nursing note and vitals reviewed.  ED Course  Procedures (including critical care time)  Medications - No data to display  DIAGNOSTIC STUDIES: Oxygen Saturation is 94% on RA, low by my interpretation.    COORDINATION OF CARE: 12:24 AM- Discussed plans to order diagnostic lab work and EKG. Pt advised of plan for treatment and pt agrees.  Poison control recommended observation to about 3 AM and if patient seemed improved she could be discharged at my  discretion.  3:22 AM - Patient is more alert upon examination, and sister states that she is back to baseline.  Results for orders placed or performed during the hospital encounter of 11/01/14  Comprehensive metabolic panel  Result Value Ref Range  Sodium 136 135 - 145 mmol/L   Potassium 3.4 (L) 3.5 - 5.1 mmol/L   Chloride 99 96 - 112 mEq/L   CO2 29 19 - 32 mmol/L   Glucose, Bld 114 (H) 70 - 99 mg/dL   BUN 12 6 - 23 mg/dL   Creatinine, Ser 1.610.93 0.50 - 1.10 mg/dL   Calcium 8.5 8.4 - 09.610.5 mg/dL   Total Protein 6.4 6.0 - 8.3 g/dL   Albumin 3.6 3.5 - 5.2 g/dL   AST 27 0 - 37 U/L   ALT 23 0 - 35 U/L   Alkaline Phosphatase 57 39 - 117 U/L   Total Bilirubin 0.5 0.3 - 1.2 mg/dL   GFR calc non Af Amer 63 (L) >90 mL/min   GFR calc Af Amer 73 (L) >90 mL/min   Anion gap 8 5 - 15  CBC with Differential  Result Value Ref Range   WBC 7.1 4.0 - 10.5 K/uL   RBC 3.42 (L) 3.87 - 5.11 MIL/uL   Hemoglobin 9.4 (L) 12.0 - 15.0 g/dL   HCT 04.530.3 (L) 40.936.0 - 81.146.0 %   MCV 88.6 78.0 - 100.0 fL   MCH 27.5 26.0 - 34.0 pg   MCHC 31.0 30.0 - 36.0 g/dL   RDW 91.416.0 (H) 78.211.5 - 95.615.5 %   Platelets 244 150 - 400 K/uL   Neutrophils Relative % 73 43 - 77 %   Neutro Abs 5.2 1.7 - 7.7 K/uL   Lymphocytes Relative 16 12 - 46 %   Lymphs Abs 1.1 0.7 - 4.0 K/uL   Monocytes Relative 8 3 - 12 %   Monocytes Absolute 0.6 0.1 - 1.0 K/uL   Eosinophils Relative 2 0 - 5 %   Eosinophils Absolute 0.1 0.0 - 0.7 K/uL   Basophils Relative 1 0 - 1 %   Basophils Absolute 0.1 0.0 - 0.1 K/uL  Troponin I  Result Value Ref Range   Troponin I 0.03 <0.031 ng/mL  Digoxin level  Result Value Ref Range   Digoxin Level 1.0 0.8 - 2.0 ng/mL  Protime-INR  Result Value Ref Range   Prothrombin Time 20.8 (H) 11.6 - 15.2 seconds   INR 1.77 (H) 0.00 - 1.49  Acetaminophen level  Result Value Ref Range   Acetaminophen (Tylenol), Serum <10.0 (L) 10 - 30 ug/mL  Salicylate level  Result Value Ref Range   Salicylate Lvl <4.0 2.8 - 20.0 mg/dL   Ethanol  Result Value Ref Range   Alcohol, Ethyl (B) <5 0 - 9 mg/dL    Laboratory interpretation all normal except several therapeutic INR (patient states she missed her dose when she drove here from out of town), stable anemia No results found.    EKG Interpretation  Date/Time:  Saturday November 02 2014 00:05:12 EST Ventricular Rate:  65 PR Interval:    QRS Duration: 178 QT Interval:  501 QTC Calculation: 521 R Axis:   -73 Text Interpretation:  ATRIAL PACED RHYTHM Since last tracing 25 Nov 2011 Prolonged QT interval Baseline wander in lead(s) I III aVR aVL Confirmed by Binyamin Nelis  MD-I, Terrace Chiem ([id number]) on 11/02/2014 2:24:48 AM        MDM   Final diagnoses:  Drug ingestion, accidental, initial encounter   Plan discharge  Devoria AlbeIva Jodilyn Giese, MD, FACEP   I personally performed the services described in this documentation, which was scribed in my presence. The recorded information has been reviewed and considered.  Devoria AlbeIva Inioluwa Boulay, MD, FACEP   Ward GivensIva L Jovani Flury, MD 11/02/14 [phone]

## 2023-10-09 DEATH — deceased
